# Patient Record
Sex: Male | Born: 1940 | Race: White | Hispanic: No | Marital: Married | Smoking: Former smoker
Health system: Southern US, Community
[De-identification: ages and names within clinical notes are randomized; demographics above are authoritative.]

## PROBLEM LIST (undated history)

## (undated) DIAGNOSIS — C61 Malignant neoplasm of prostate: Secondary | ICD-10-CM

## (undated) DIAGNOSIS — I714 Abdominal aortic aneurysm, without rupture, unspecified: Secondary | ICD-10-CM

## (undated) DIAGNOSIS — I10 Essential (primary) hypertension: Secondary | ICD-10-CM

## (undated) DIAGNOSIS — E785 Hyperlipidemia, unspecified: Secondary | ICD-10-CM

## (undated) DIAGNOSIS — M1990 Unspecified osteoarthritis, unspecified site: Secondary | ICD-10-CM

## (undated) DIAGNOSIS — I723 Aneurysm of iliac artery: Secondary | ICD-10-CM

## (undated) HISTORY — DX: Essential (primary) hypertension: I10

## (undated) HISTORY — PX: HERNIA REPAIR: SHX51

## (undated) HISTORY — DX: Aneurysm of iliac artery: I72.3

## (undated) HISTORY — PX: PROSTATE BIOPSY: SHX241

## (undated) HISTORY — DX: Abdominal aortic aneurysm, without rupture, unspecified: I71.40

## (undated) HISTORY — DX: Unspecified osteoarthritis, unspecified site: M19.90

## (undated) HISTORY — DX: Abdominal aortic aneurysm, without rupture: I71.4

## (undated) HISTORY — PX: ABDOMINAL AORTIC ANEURYSM REPAIR: SUR1152

## (undated) HISTORY — DX: Hyperlipidemia, unspecified: E78.5

---

## 1999-03-15 ENCOUNTER — Encounter: Payer: Self-pay | Admitting: *Deleted

## 1999-03-15 ENCOUNTER — Encounter: Admission: RE | Admit: 1999-03-15 | Discharge: 1999-03-15 | Payer: Self-pay | Admitting: *Deleted

## 2000-03-16 ENCOUNTER — Encounter: Payer: Self-pay | Admitting: *Deleted

## 2000-03-16 ENCOUNTER — Encounter: Admission: RE | Admit: 2000-03-16 | Discharge: 2000-03-16 | Payer: Self-pay | Admitting: *Deleted

## 2000-03-20 ENCOUNTER — Encounter: Payer: Self-pay | Admitting: *Deleted

## 2000-03-20 ENCOUNTER — Encounter: Admission: RE | Admit: 2000-03-20 | Discharge: 2000-03-20 | Payer: Self-pay | Admitting: *Deleted

## 2002-07-31 ENCOUNTER — Encounter: Payer: Self-pay | Admitting: Family Medicine

## 2002-07-31 ENCOUNTER — Encounter: Admission: RE | Admit: 2002-07-31 | Discharge: 2002-07-31 | Payer: Self-pay | Admitting: Family Medicine

## 2005-04-21 ENCOUNTER — Ambulatory Visit: Payer: Self-pay | Admitting: Family Medicine

## 2005-04-26 ENCOUNTER — Ambulatory Visit: Payer: Self-pay | Admitting: Family Medicine

## 2005-04-27 ENCOUNTER — Encounter: Admission: RE | Admit: 2005-04-27 | Discharge: 2005-04-27 | Payer: Self-pay | Admitting: Family Medicine

## 2005-06-10 ENCOUNTER — Ambulatory Visit: Payer: Self-pay | Admitting: Cardiology

## 2006-04-21 ENCOUNTER — Ambulatory Visit: Payer: Self-pay

## 2006-05-03 ENCOUNTER — Ambulatory Visit: Payer: Self-pay | Admitting: Family Medicine

## 2006-05-03 LAB — CONVERTED CEMR LAB
AST: 29 units/L (ref 0–37)
Albumin: 4 g/dL (ref 3.5–5.2)
Alkaline Phosphatase: 46 units/L (ref 39–117)
Basophils Absolute: 0 10*3/uL (ref 0.0–0.1)
CO2: 31 meq/L (ref 19–32)
Chloride: 103 meq/L (ref 96–112)
Creatinine, Ser: 0.7 mg/dL (ref 0.4–1.5)
GFR calc non Af Amer: 121 mL/min
Glucose, Bld: 119 mg/dL — ABNORMAL HIGH (ref 70–99)
HCT: 47.4 % (ref 39.0–52.0)
HDL: 52.1 mg/dL (ref >39.0)
Hgb A1c MFr Bld: 5.8 % (ref 4.6–6.0)
LDL Cholesterol: 93 mg/dL (ref 0–99)
Lymphocytes Relative: 25.6 % (ref 12.0–46.0)
MCHC: 34.3 g/dL (ref 30.0–36.0)
MCV: 95.6 fL (ref 78.0–100.0)
Monocytes Relative: 8.7 % (ref 3.0–11.0)
Neutro Abs: 4.8 10*3/uL (ref 1.4–7.7)
Neutrophils Relative %: 59 % (ref 43.0–77.0)
Platelets: 244 10*3/uL (ref 150–400)
Potassium: 3.9 meq/L (ref 3.5–5.1)
RBC: 4.95 M/uL (ref 4.22–5.81)
Sodium: 143 meq/L (ref 135–145)
Total Bilirubin: 1.6 mg/dL — ABNORMAL HIGH (ref 0.3–1.2)
VLDL: 17 mg/dL (ref 0–40)

## 2006-05-10 ENCOUNTER — Ambulatory Visit: Payer: Self-pay | Admitting: Family Medicine

## 2006-12-08 DIAGNOSIS — I10 Essential (primary) hypertension: Secondary | ICD-10-CM | POA: Insufficient documentation

## 2007-05-03 ENCOUNTER — Ambulatory Visit: Payer: Self-pay

## 2007-05-03 ENCOUNTER — Encounter: Payer: Self-pay | Admitting: Family Medicine

## 2007-05-09 ENCOUNTER — Telehealth: Payer: Self-pay | Admitting: Family Medicine

## 2007-05-24 DIAGNOSIS — I723 Aneurysm of iliac artery: Secondary | ICD-10-CM

## 2007-05-24 HISTORY — DX: Aneurysm of iliac artery: I72.3

## 2007-08-14 ENCOUNTER — Encounter: Payer: Self-pay | Admitting: Family Medicine

## 2007-08-15 ENCOUNTER — Ambulatory Visit: Payer: Self-pay | Admitting: Family Medicine

## 2007-08-15 DIAGNOSIS — Z9889 Other specified postprocedural states: Secondary | ICD-10-CM

## 2007-08-15 DIAGNOSIS — E785 Hyperlipidemia, unspecified: Secondary | ICD-10-CM | POA: Insufficient documentation

## 2007-08-15 DIAGNOSIS — M109 Gout, unspecified: Secondary | ICD-10-CM

## 2007-08-15 LAB — CONVERTED CEMR LAB
ALT: 59 units/L — ABNORMAL HIGH (ref 0–53)
AST: 36 units/L (ref 0–37)
BUN: 14 mg/dL (ref 6–23)
Basophils Absolute: 0 10*3/uL (ref 0.0–0.1)
Bilirubin, Direct: 0.3 mg/dL (ref 0.0–0.3)
Cholesterol: 168 mg/dL (ref 0–200)
Eosinophils Absolute: 0.5 10*3/uL (ref 0.0–0.6)
GFR calc non Af Amer: 103 mL/min
Glucose, Bld: 114 mg/dL — ABNORMAL HIGH (ref 70–99)
HCT: 51.4 % (ref 39.0–52.0)
Hemoglobin: 16.9 g/dL (ref 13.0–17.0)
Ketones, urine, test strip: NEGATIVE
LDL Cholesterol: 104 mg/dL — ABNORMAL HIGH (ref 0–99)
Lymphocytes Relative: 26.2 % (ref 12.0–46.0)
MCHC: 33 g/dL (ref 30.0–36.0)
MCV: 95.8 fL (ref 78.0–100.0)
Monocytes Absolute: 0.8 10*3/uL — ABNORMAL HIGH (ref 0.2–0.7)
Neutro Abs: 5.2 10*3/uL (ref 1.4–7.7)
Potassium: 5.1 meq/L (ref 3.5–5.1)
RDW: 12 % (ref 11.5–14.6)
Sodium: 142 meq/L (ref 135–145)
TSH: 2.11 microintl units/mL (ref 0.35–5.50)
Total CHOL/HDL Ratio: 3.7
Uric Acid, Serum: 9.4 mg/dL — ABNORMAL HIGH (ref 2.4–7.0)
Urobilinogen, UA: 0.2
pH: 5.5

## 2007-08-21 ENCOUNTER — Telehealth: Payer: Self-pay | Admitting: Family Medicine

## 2007-09-06 ENCOUNTER — Telehealth (INDEPENDENT_AMBULATORY_CARE_PROVIDER_SITE_OTHER): Payer: Self-pay | Admitting: *Deleted

## 2007-09-07 ENCOUNTER — Telehealth (INDEPENDENT_AMBULATORY_CARE_PROVIDER_SITE_OTHER): Payer: Self-pay | Admitting: *Deleted

## 2007-09-24 ENCOUNTER — Ambulatory Visit: Payer: Self-pay | Admitting: Internal Medicine

## 2007-09-25 ENCOUNTER — Ambulatory Visit: Payer: Self-pay | Admitting: Vascular Surgery

## 2007-10-02 ENCOUNTER — Ambulatory Visit: Payer: Self-pay | Admitting: Vascular Surgery

## 2007-10-02 ENCOUNTER — Encounter: Admission: RE | Admit: 2007-10-02 | Discharge: 2007-10-02 | Payer: Self-pay | Admitting: Vascular Surgery

## 2007-10-09 ENCOUNTER — Telehealth: Payer: Self-pay | Admitting: Family Medicine

## 2007-10-10 ENCOUNTER — Ambulatory Visit: Payer: Self-pay | Admitting: Internal Medicine

## 2007-10-22 ENCOUNTER — Ambulatory Visit: Payer: Self-pay

## 2007-10-30 ENCOUNTER — Ambulatory Visit: Payer: Self-pay | Admitting: Surgery

## 2007-10-30 ENCOUNTER — Ambulatory Visit (HOSPITAL_COMMUNITY): Admission: RE | Admit: 2007-10-30 | Discharge: 2007-10-30 | Payer: Self-pay | Admitting: Surgery

## 2007-11-02 ENCOUNTER — Inpatient Hospital Stay (HOSPITAL_COMMUNITY): Admission: RE | Admit: 2007-11-02 | Discharge: 2007-11-03 | Payer: Self-pay | Admitting: Vascular Surgery

## 2007-11-20 ENCOUNTER — Ambulatory Visit: Payer: Self-pay | Admitting: Vascular Surgery

## 2007-11-27 ENCOUNTER — Encounter: Admission: RE | Admit: 2007-11-27 | Discharge: 2007-11-27 | Payer: Self-pay | Admitting: Vascular Surgery

## 2007-11-27 ENCOUNTER — Ambulatory Visit: Payer: Self-pay | Admitting: Vascular Surgery

## 2007-11-30 ENCOUNTER — Inpatient Hospital Stay (HOSPITAL_COMMUNITY): Admission: RE | Admit: 2007-11-30 | Discharge: 2007-12-01 | Payer: Self-pay | Admitting: Vascular Surgery

## 2007-11-30 IMAGING — RF DG ABDOMEN 1V
1 series · 4 of 4 positions shown · non-contrast
Comparison: [DATE]

CLINICAL DATA: Abdominal aortic aneurysm, stent graft repair

ABDOMEN - 1 VIEW

[Series 1: run · 4 of 4 slices shown]
[im 1/4]
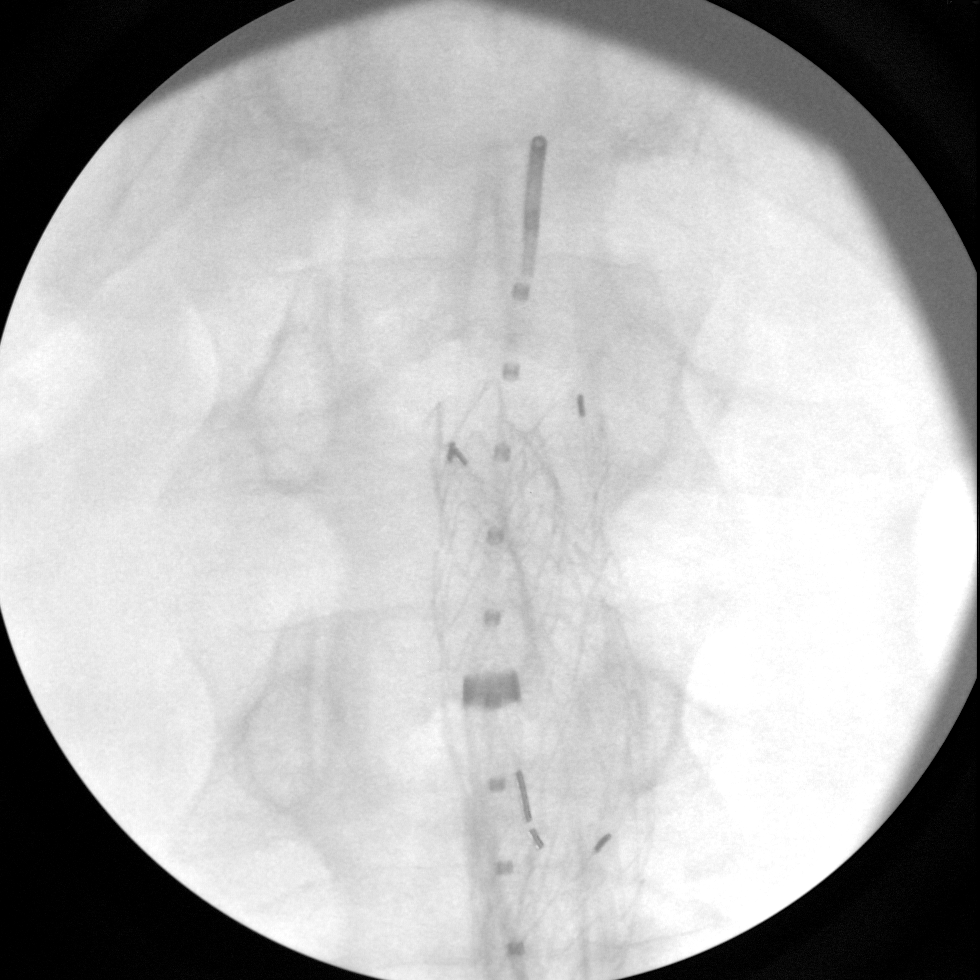
[im 2/4]
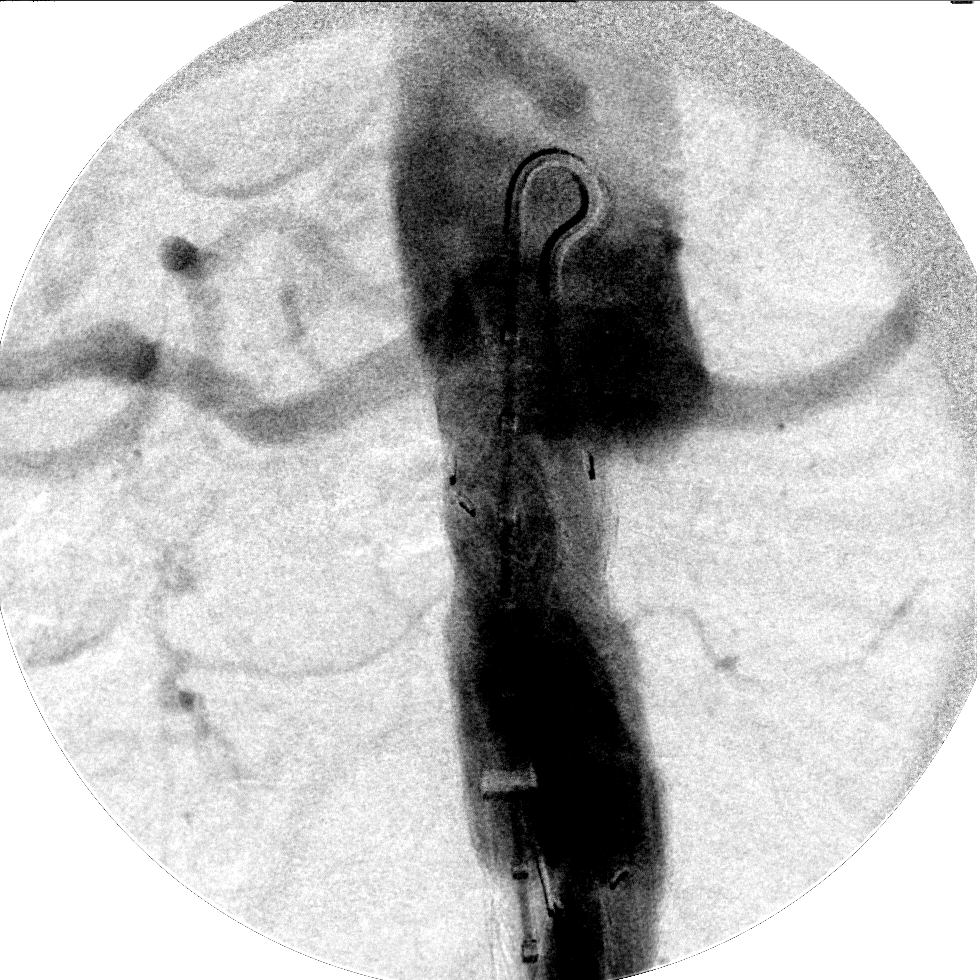
[im 3/4]
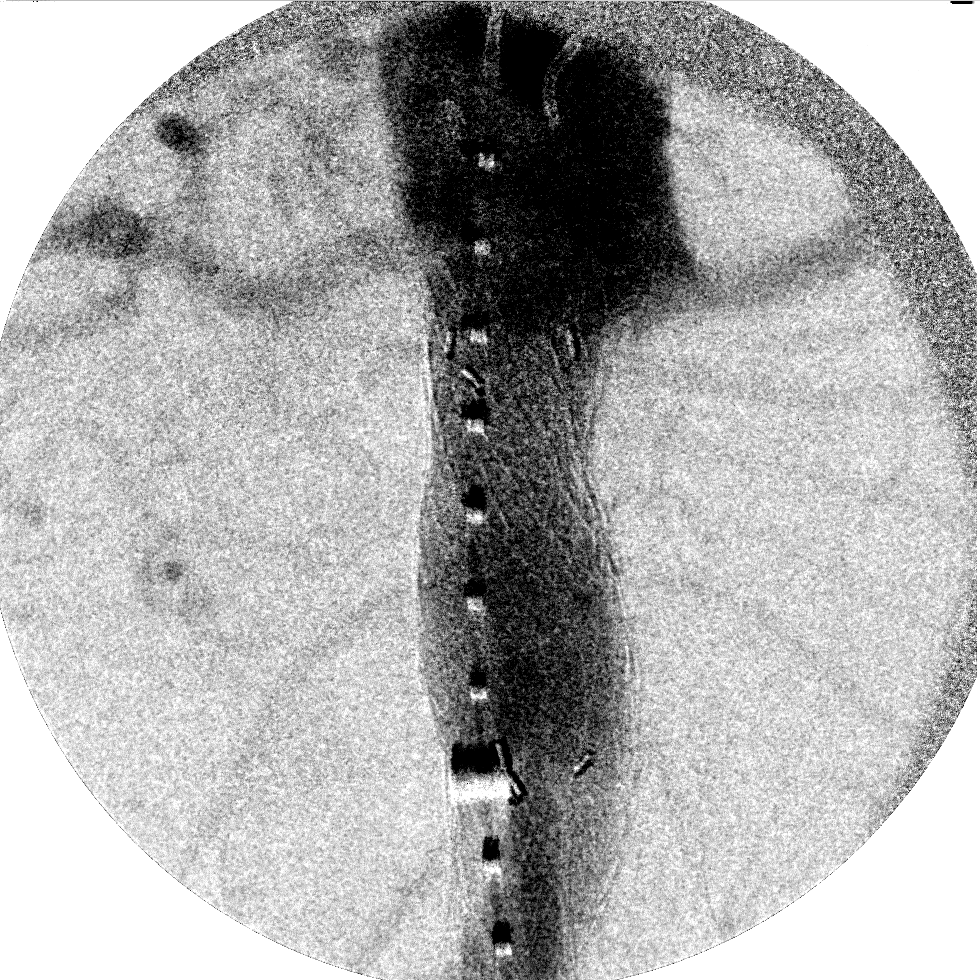
[im 4/4]
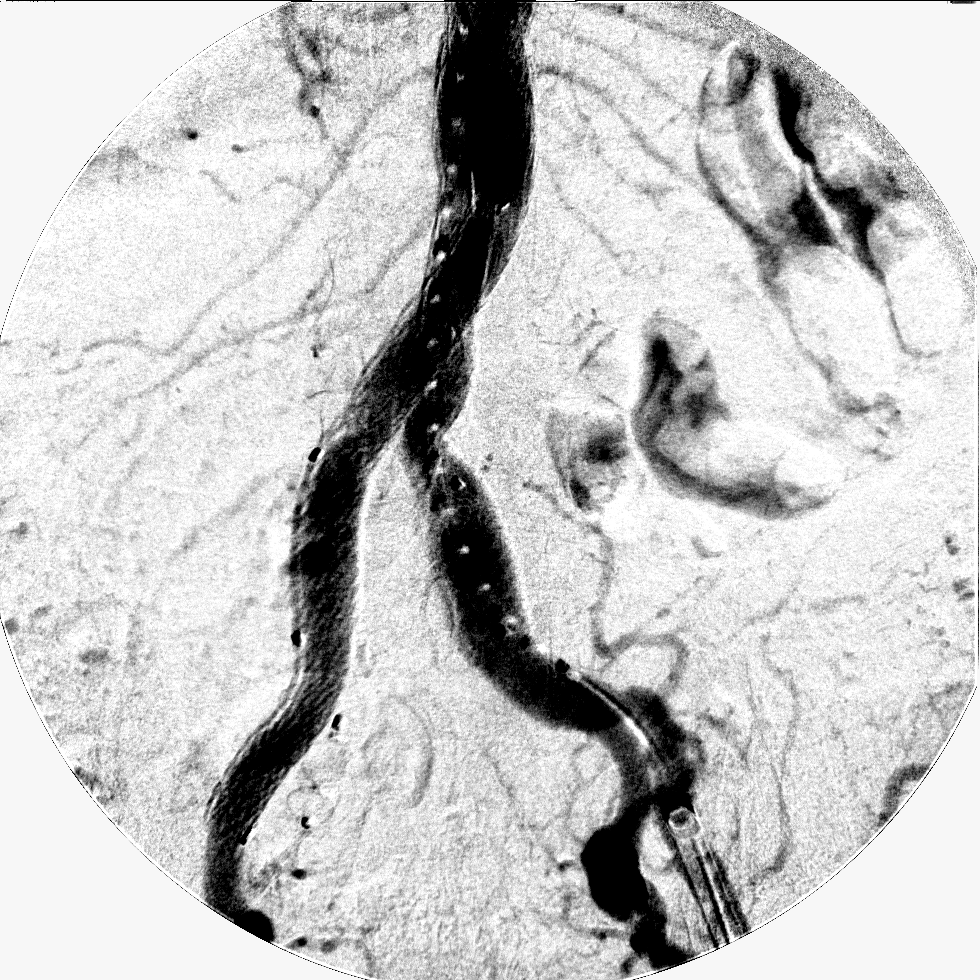

[4 of 4 positions shown; findings below may reference images not displayed]

FINDINGS: Four spot fluoroscopic views were obtained during
intraoperative angiogram of the abdominal aortic stent graft.
Within the limits of the study, the aortic stent graft is patent.
Iliac limbs are patent.  No evidence of stenosis or occlusion.
IMPRESSION: Patent aortic stent graft.

## 2007-12-17 ENCOUNTER — Ambulatory Visit: Payer: Self-pay | Admitting: Vascular Surgery

## 2007-12-18 ENCOUNTER — Ambulatory Visit: Payer: Self-pay | Admitting: Vascular Surgery

## 2007-12-20 ENCOUNTER — Ambulatory Visit: Payer: Self-pay | Admitting: *Deleted

## 2007-12-25 ENCOUNTER — Ambulatory Visit: Payer: Self-pay | Admitting: Vascular Surgery

## 2008-01-08 ENCOUNTER — Ambulatory Visit: Payer: Self-pay | Admitting: Vascular Surgery

## 2008-01-08 ENCOUNTER — Encounter: Admission: RE | Admit: 2008-01-08 | Discharge: 2008-01-08 | Payer: Self-pay | Admitting: Vascular Surgery

## 2008-07-01 ENCOUNTER — Ambulatory Visit: Payer: Self-pay | Admitting: Vascular Surgery

## 2008-07-01 ENCOUNTER — Encounter: Admission: RE | Admit: 2008-07-01 | Discharge: 2008-07-01 | Payer: Self-pay | Admitting: Vascular Surgery

## 2009-01-20 ENCOUNTER — Encounter: Admission: RE | Admit: 2009-01-20 | Discharge: 2009-01-20 | Payer: Self-pay | Admitting: Vascular Surgery

## 2009-01-20 ENCOUNTER — Ambulatory Visit: Payer: Self-pay | Admitting: Vascular Surgery

## 2009-05-05 ENCOUNTER — Ambulatory Visit: Payer: Self-pay | Admitting: Family Medicine

## 2009-07-13 ENCOUNTER — Ambulatory Visit: Payer: Self-pay | Admitting: Vascular Surgery

## 2010-01-19 ENCOUNTER — Encounter: Admission: RE | Admit: 2010-01-19 | Discharge: 2010-01-19 | Payer: Self-pay | Admitting: Vascular Surgery

## 2010-01-19 ENCOUNTER — Ambulatory Visit: Payer: Self-pay | Admitting: Vascular Surgery

## 2010-06-13 ENCOUNTER — Encounter: Payer: Self-pay | Admitting: Family Medicine

## 2010-06-20 LAB — CONVERTED CEMR LAB
AST: 36 units/L (ref 0–37)
Basophils Absolute: 0 10*3/uL (ref 0.0–0.1)
Bilirubin, Direct: 0.2 mg/dL (ref 0.0–0.3)
Blood in Urine, dipstick: NEGATIVE
Eosinophils Relative: 7.3 % — ABNORMAL HIGH (ref 0.0–5.0)
GFR calc non Af Amer: 119.35 mL/min (ref >60)
Glucose, Urine, Semiquant: NEGATIVE
HCT: 44.5 % (ref 39.0–52.0)
HDL: 42.9 mg/dL (ref >39.00)
Hemoglobin: 15.6 g/dL (ref 13.0–17.0)
Lymphocytes Relative: 33.8 % (ref 12.0–46.0)
MCHC: 35 g/dL (ref 30.0–36.0)
MCV: 95.1 fL (ref 78.0–100.0)
Monocytes Absolute: 0.6 10*3/uL (ref 0.1–1.0)
Neutrophils Relative %: 50.8 % (ref 43.0–77.0)
Nitrite: NEGATIVE
RBC: 4.68 M/uL (ref 4.22–5.81)
Specific Gravity, Urine: 1.02
TSH: 2.15 microintl units/mL (ref 0.35–5.50)
Total CHOL/HDL Ratio: 3
Uric Acid, Serum: 9.1 mg/dL — ABNORMAL HIGH (ref 4.0–7.8)
Urobilinogen, UA: 0.2
VLDL: 19.6 mg/dL (ref 0.0–40.0)
WBC Urine, dipstick: NEGATIVE
WBC: 7.9 10*3/uL (ref 4.5–10.5)
pH: 5

## 2010-08-09 ENCOUNTER — Other Ambulatory Visit: Payer: Self-pay | Admitting: Family Medicine

## 2010-08-10 ENCOUNTER — Telehealth: Payer: Self-pay | Admitting: Family Medicine

## 2010-08-10 NOTE — Telephone Encounter (Signed)
Pt needs refills call into [facility] [phone] of ziac 2.5-6.25mg . Pt is sch for physical on 09-29-2010

## 2010-08-11 ENCOUNTER — Other Ambulatory Visit: Payer: Self-pay

## 2010-08-11 MED ORDER — COLCHICINE 0.6 MG PO TABS: 0.6000 mg | ORAL_TABLET | Freq: Three times a day (TID) | ORAL | Status: DC | PRN

## 2010-08-11 MED ORDER — ALLOPURINOL 300 MG PO TABS: 300.0000 mg | ORAL_TABLET | Freq: Every day | ORAL | Status: DC

## 2010-08-11 MED ORDER — BISOPROLOL-HYDROCHLOROTHIAZIDE 2.5-6.25 MG PO TABS: 1.0000 | ORAL_TABLET | Freq: Every day | ORAL | Status: DC

## 2010-08-11 MED ORDER — ATORVASTATIN CALCIUM 20 MG PO TABS: 20.0000 mg | ORAL_TABLET | Freq: Every day | ORAL | Status: DC

## 2010-08-11 NOTE — Telephone Encounter (Signed)
Pt was into office today - all meds refilled per Dr. Tawanna Cooler - sent to pharmacy. KIK

## 2010-09-29 ENCOUNTER — Ambulatory Visit (INDEPENDENT_AMBULATORY_CARE_PROVIDER_SITE_OTHER): Payer: Medicare Other | Admitting: Family Medicine

## 2010-09-29 ENCOUNTER — Encounter: Payer: Self-pay | Admitting: Family Medicine

## 2010-09-29 DIAGNOSIS — M109 Gout, unspecified: Secondary | ICD-10-CM

## 2010-09-29 DIAGNOSIS — R4789 Other speech disturbances: Secondary | ICD-10-CM

## 2010-09-29 DIAGNOSIS — N139 Obstructive and reflux uropathy, unspecified: Secondary | ICD-10-CM

## 2010-09-29 DIAGNOSIS — I10 Essential (primary) hypertension: Secondary | ICD-10-CM

## 2010-09-29 DIAGNOSIS — R4702 Dysphasia: Secondary | ICD-10-CM

## 2010-09-29 DIAGNOSIS — E785 Hyperlipidemia, unspecified: Secondary | ICD-10-CM

## 2010-09-29 DIAGNOSIS — N138 Other obstructive and reflux uropathy: Secondary | ICD-10-CM

## 2010-09-29 DIAGNOSIS — L309 Dermatitis, unspecified: Secondary | ICD-10-CM | POA: Insufficient documentation

## 2010-09-29 DIAGNOSIS — L259 Unspecified contact dermatitis, unspecified cause: Secondary | ICD-10-CM

## 2010-09-29 DIAGNOSIS — N401 Enlarged prostate with lower urinary tract symptoms: Secondary | ICD-10-CM

## 2010-09-29 DIAGNOSIS — Z9889 Other specified postprocedural states: Secondary | ICD-10-CM

## 2010-09-29 LAB — BASIC METABOLIC PANEL
CO2: 27 mEq/L (ref 19–32)
Calcium: 9.3 mg/dL (ref 8.4–10.5)
Creatinine, Ser: 0.7 mg/dL (ref 0.4–1.5)
GFR: 111.48 mL/min (ref >60.00)
Glucose, Bld: 113 mg/dL — ABNORMAL HIGH (ref 70–99)
Sodium: 141 mEq/L (ref 135–145)

## 2010-09-29 LAB — CBC WITH DIFFERENTIAL/PLATELET
Basophils Absolute: 0 10*3/uL (ref 0.0–0.1)
Eosinophils Absolute: 0.5 10*3/uL (ref 0.0–0.7)
Hemoglobin: 15.9 g/dL (ref 13.0–17.0)
Lymphocytes Relative: 26.8 % (ref 12.0–46.0)
MCHC: 34.4 g/dL (ref 30.0–36.0)
Monocytes Relative: 8.6 % (ref 3.0–12.0)
Neutrophils Relative %: 58.3 % (ref 43.0–77.0)
RBC: 4.66 Mil/uL (ref 4.22–5.81)
RDW: 13.8 % (ref 11.5–14.6)

## 2010-09-29 LAB — POCT URINALYSIS DIPSTICK
Bilirubin, UA: NEGATIVE
Blood, UA: NEGATIVE
Glucose, UA: NEGATIVE
Spec Grav, UA: 1.025

## 2010-09-29 LAB — LIPID PANEL
Cholesterol: 175 mg/dL (ref 0–200)
Triglycerides: 104 mg/dL (ref 0.0–149.0)

## 2010-09-29 LAB — TSH: TSH: 1.65 u[IU]/mL (ref 0.35–5.50)

## 2010-09-29 LAB — HEPATIC FUNCTION PANEL
ALT: 37 U/L (ref 0–53)
Albumin: 4 g/dL (ref 3.5–5.2)
Bilirubin, Direct: 0.2 mg/dL (ref 0.0–0.3)
Total Protein: 6.8 g/dL (ref 6.0–8.3)

## 2010-09-29 LAB — PSA: PSA: 3.33 ng/mL (ref 0.10–4.00)

## 2010-09-29 LAB — URIC ACID: Uric Acid, Serum: 5.8 mg/dL (ref 4.0–7.8)

## 2010-09-29 MED ORDER — BISOPROLOL-HYDROCHLOROTHIAZIDE 2.5-6.25 MG PO TABS: 1.0000 | ORAL_TABLET | Freq: Every day | ORAL | Status: DC

## 2010-09-29 MED ORDER — ATORVASTATIN CALCIUM 20 MG PO TABS: 20.0000 mg | ORAL_TABLET | Freq: Every day | ORAL | Status: DC

## 2010-09-29 MED ORDER — ALLOPURINOL 300 MG PO TABS: 300.0000 mg | ORAL_TABLET | Freq: Every day | ORAL | Status: DC

## 2010-09-29 MED ORDER — PREDNISONE 20 MG PO TABS: ORAL_TABLET | ORAL | Status: DC

## 2010-09-29 NOTE — Patient Instructions (Signed)
Continue your current medications.  Let's take a 3 month course of prednisone to try to keep y hand to heal  Return in one year, sooner if any problem

## 2010-09-29 NOTE — Progress Notes (Signed)
Subjective:    Patient ID: Adrian Reynolds, male    DOB: [birth year], 70 y.o.   MRN: [id number]  HPI Adrian Reynolds is a delightful, 70 year old, married man nonsmoker, who comes in today for a Medicare wellness examination because of a history of gout, hyperlipidemia, hypertension, history of ruptured aortic aneurysm, and eczema.  He takes allopurinol 300 mg daily and has no gouty attacks.  He takes Lipitor 20 mg nightly for hyperlipidemia.  Will check lipid panel today.  He takes Ziac 2.5 -- 6.25 daily for hypertension.  BP 130/80 at home.  He gets every 6 month ultrasound because of a history of a ruptured aortic aneurysm.  Asymptomatic followed by vascular surgery Dr. Hart Rochester.  He has a history of eczema.  He is tried numerous medications none of which works.  He's been given a shot of steroids every 3 months at the dermatologist office.  Last steroid injection was 3 months ago.  We talked about alternatives he would like to try some oral prednisone.  History eye  care, dental care, hearing normal, activities of daily living.  Normal.  He walks daily.  No guns in the house.  He does have a living will and health-care power-of-attorney.  Tetanus 2006, Pneumovax 2009, information given on shingles, colonoscopy, normal in GI.  Review of systems negative except is having trouble swallowing.  He says for the past year staging hotdogs gets stuck in his upper esophagus.  We will refer him to GI for endoscopy.  It sounds like he has a stricture   Review of Systems  Constitutional: Negative.   HENT: Negative.   Eyes: Negative.   Respiratory: Negative.   Cardiovascular: Negative.   Gastrointestinal: Negative.   Genitourinary: Negative.   Musculoskeletal: Negative.   Skin: Positive for rash.  Neurological: Negative.   Hematological: Negative.   Psychiatric/Behavioral: Negative.        Objective:   Physical Exam  Constitutional: He is oriented to person, place, and time. He appears  well-developed and well-nourished.  HENT:  Head: Normocephalic and atraumatic.  Right Ear: External ear normal.  Left Ear: External ear normal.  Nose: Nose normal.  Mouth/Throat: Oropharynx is clear and moist.  Eyes: Conjunctivae and EOM are normal. Pupils are equal, round, and reactive to light.  Neck: Normal range of motion. Neck supple. No JVD present. No tracheal deviation present. No thyromegaly present.  Cardiovascular: Normal rate, regular rhythm, normal heart sounds and intact distal pulses.  Exam reveals no gallop and no friction rub.   No murmur heard. Pulmonary/Chest: Effort normal and breath sounds normal. No stridor. No respiratory distress. He has no wheezes. He has no rales. He exhibits no tenderness.  Abdominal: Soft. Bowel sounds are normal. He exhibits no distension and no mass. There is no tenderness. There is no rebound and no guarding.  Genitourinary: Rectum normal, prostate normal and penis normal. Guaiac negative stool. No penile tenderness.  Musculoskeletal: Normal range of motion. He exhibits no edema and no tenderness.  Lymphadenopathy:    He has no cervical adenopathy.  Neurological: He is alert and oriented to person, place, and time. He has normal reflexes. No cranial nerve deficit. He exhibits normal muscle tone.  Skin: Skin is warm and dry. No rash noted. No erythema. No pallor.       Scar in the midline from previous AAA repair, also eczema hands and severe with cracking of the skin  Psychiatric: He has a normal mood and affect. His behavior is normal. Judgment and  thought content normal.          Assessment & Plan:  Healthy male.  History of gout continue allopurinol 300 mg daily.  History of hyperlipidemia.  Continue Lipitor 20 daily.  History of hypertension.  Continue Ziac 2.5 -- 6.5 daily.  History of TURP for a repair.  Follow-up every 6 months by Dr. Hart Rochester.  History of eczema, prednisone burst and taper.  History of recent upper  esophageal symptoms consistent with a stricture referred to GI for endoscopy evaluation

## 2010-09-30 NOTE — Progress Notes (Signed)
patient  Is aware 

## 2010-10-05 NOTE — H&P (Signed)
NAMESHAHZAIN, KIESTER               ACCOUNT NO.:  [id number]   MEDICAL RECORD NO.:  [id number]          PATIENT TYPE:  INP   LOCATION:  3308                         FACILITY:  [facility]   PHYSICIAN:  Quita Skye. Hart Rochester, M.D.  DATE OF BIRTH:  08-Nov-1940   DATE OF ADMISSION:  11/30/2007  DATE OF DISCHARGE:                              HISTORY & PHYSICAL   CHIEF COMPLAINT:  Infolding of proximal portion of Gore aortic stent  graft.   HISTORY OF PRESENT ILLNESS:  This 70 year old male patient underwent  aortic stent grafting by Dr. Hart Rochester on May 19 for right common iliac  aneurysm.  The patient had previously had a ruptured abdominal aortic  aneurysm at [facility] repaired by Dr. 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  After  the stent graft was inserted, he had an unremarkable course, was  discharged on the first postoperative day, and returned in 4 weeks for  his routine CT angiogram.  This revealed some infolding of the left  portion of the proximal part of the Gore aortic stent graft just below  the renal arteries.  There was no hemodynamic consequences of this with  excellent distal pulses, normal pressures, and good waveforms in the  feet.  After studying this carefully, consulting with my colleagues and  the [employer], we decided that there was a concern that, that  could become a hemodynamic consequence of this if we left it treated or  could be a site for thrombus formation, so ideally this should be  treated with balloon angioplasty and Palmaz stenting.  Therefore, he was  admitted at this time for that procedure.   PAST MEDICAL HISTORY:  1. Hypertension.  2. Hyperlipidemia.  3. Negative for diabetes, coronary artery disease, COPD, and stroke.   PAST SURGICAL HISTORY:  1. Resection of abdominal aortic aneurysm.  2. Mesh ventral hernia repair for incisional hernia.  3. Aorto-bi-iliac stent grafting as mentioned  above.   FAMILY HISTORY:  Positive for diabetes, coronary artery disease, and  stroke in his father who underwent coronary artery bypass grafting.   SOCIAL HISTORY:  He has 3 children, is not retired, owns a [employer], married, does not use tobacco and has not for the past  15 years, drinks occasional alcohol.   REVIEW OF SYSTEMS:  Unremarkable.   ALLERGIES:  None known.   MEDICATIONS:  Lipitor.   PHYSICAL EXAMINATION:  VITAL SIGNS:  Blood pressure 154/84, heart rate  56, respirations 14.  GENERAL:  He is a healthy-appearing middle-aged male in no apparent  distress.  Alert and oriented x3.  NECK:  Supple with 3+ carotid pulses palpable.  No bruits are audible.  NEUROLOGIC:  Normal.  ABDOMEN:  Soft, nontender with no palpable masses.  He has well-healed  midline incision.  He has well-healed suprainguinal incision bilaterally  with 3+ femoral, popliteal, and dorsalis pedis pulses.   IMPRESSION:  1. Infolding of aorto-bi-iliac stent graft, recently placed.  2. History of ruptured abdominal aortic aneurysm.  3. Status post repair of ventral hernia  with mesh.  4. Hypertension.   PLAN:  Admit the patient on July 10 for attempt at balloon angioplasty  and insertion of Palmaz stent at the proximal portion of this aortic  graft to relieve the infolding of the graft.  Risks and benefits have  been thoroughly discussed with the patient and he would like to proceed.      Quita Skye Hart Rochester, M.D.  Electronically Signed     JDL/MEDQ  D:  11/30/2007  T:  11/30/2007  Job:  [id number]

## 2010-10-05 NOTE — Assessment & Plan Note (Signed)
OFFICE VISIT   Adrian Reynolds, Adrian Reynolds  DOB:  02-10-1941                                       07/01/2008  ZOXWR#:[id number]   The patient returns today for further followup of his aortic stent graft  to repair a right iliac artery aneurysm.  He had previously undergone an  aortic tube graft for a ruptured abdominal aortic aneurysm many years  ago.  He did require a second procedure because of infolding of the  proximal portion of the stent graft and this was treated with a 40 mm x  4 cm Palmaz stent with good success.  He has had no abdominal or back  symptoms and states actually that the right hip feels better than it did  prior to this procedure for some reason.  He is able to ambulate without  claudication.  He denies any chest pain, dyspnea on exertion, PND,  orthopnea or any neurologic symptoms suggestive of hemiparesis, aphasia  or amaurosis fugax.  He takes one 81 mg aspirin tablet per day.   PHYSICAL EXAMINATION:  Vital signs:  Blood pressure 150/87, heart rate  63, respirations 14.  Neck:  Carotid pulses 3+, no audible bruits.  Neurological:  Normal.  Chest:  Auscultation.  Abdomen:  Soft,  nontender, no pulsatile masses palpable.  He has 3+ femoral, popliteal  and dorsalis pedis pulses bilaterally.  The inguinal incision remains  well-healed.   CT angiogram was reviewed by me today and looks excellent with no  evidence of endoleak or migration and the infolding of the proximal  portion of the stent continues to be widely patent at this point.  He  was reassured regarding these findings and will return in 6 months for  followup CT angiogram for his 1 year check.   Quita Skye Hart Rochester, M.D.  Electronically Signed   JDL/MEDQ  D:  07/01/2008  T:  07/02/2008  Job:  [id number]   cc:   Tinnie Gens A. Tawanna Cooler, MD

## 2010-10-05 NOTE — Op Note (Signed)
Adrian Reynolds, Adrian Reynolds               ACCOUNT NO.:  [id number]   MEDICAL RECORD NO.:  [id number]          PATIENT TYPE:  INP   LOCATION:  3316                         FACILITY:  [facility]   PHYSICIAN:  Juleen China IV, MDDATE OF BIRTH:  03/14/1941   DATE OF PROCEDURE:  11/02/2007  DATE OF DISCHARGE:  11/03/2007                               OPERATIVE REPORT   PREOPERATIVE DIAGNOSIS:  Right common iliac aneurysm.   POSTOPERATIVE DIAGNOSIS:  Right common iliac aneurysm.   PROCEDURE PERFORMED:  Left groin exposure.   ANESTHESIA:  General.   BLOOD LOSS:  200 mL.   INDICATIONS:  This is a 70 year old gentleman who has previously  undergone an open intrarenal repair with two graft of a ruptured  aneurysm.  He has represented with a right common iliac aneurysm.  Prior  to his repair, he has had embolization of his right hypogastric artery  anticipation of placing in the graft.  Risks and benefits were  discussed.  Informed consent was signed.  This will be the dictation of  the exposure of the left femoral artery.  Please see Dr. Candie Chroman note  for full details of the procedure.   DESCRIPTION OF PROCEDURE:  After general anesthesia was administered,  the patient was prepped and draped in a standard sterile fashion.  A  time-out was called and antibiotics were given.  Again, I performed the  exposure of the left femoral artery.  The inguinal ligament was  identified by bony landmarks.  An oblique incision was made below the  inguinal ligament above the palpable pulse of left common femoral  artery.  Bovie cautery was used to dissect through the subcutaneous  tissue.  Self-retaining retractors were used to aid with exposure.  The  femoral sheath was identified and opened sharply.  There was hematoma  from the previous access for his arteriogram.  Ultimately, the common  femoral artery was identified and mobilized proximally and distally such  that the vessel loops could be placed.  Please  see Dr. Candie Chroman note for  full details of the endovascular portion of procedure.   Once the endovascular portion of the procedure had been completed, I set  to close the left groin.  The sheaths and wires were removed and the  cautery was occluded with a Henley clamp proximally and a peripheral  DeBakey distally.  A running 5-0 Prolene was used to close the  arteriotomy.  Prior to completion, the artery was flushed in antegrade  and retrograde fashion.  The anastomosis was secured and the clamps were  released.  Hemostasis was excellent.  The groin was then copiously  irrigated.  Femoral sheath was then reapproximated using running 2-0  Vicryl.  The subcutaneous tissue was closed with additional layers of 2-  0 and 3-0 Vicryl.  The skin was closed with 4-0 Vicryl.  Sterile  dressings were applied.  The patient tolerated the procedure well and  taken to recovery room in stable condition.           ______________________________  V. Charlena Cross, MD  Electronically Signed  VWB/MEDQ  D:  11/04/2007  T:  11/05/2007  Job:  [id number]

## 2010-10-05 NOTE — Op Note (Signed)
Adrian Reynolds, Adrian Reynolds               ACCOUNT NO.:  [id number]   MEDICAL RECORD NO.:  [id number]          PATIENT TYPE:  INP   LOCATION:  3316                         FACILITY:  [facility]   PHYSICIAN:  Quita Skye. Hart Rochester, M.D.  DATE OF BIRTH:  08/27/1940   DATE OF PROCEDURE:  11/02/2007  DATE OF DISCHARGE:                               OPERATIVE REPORT   PREOPERATIVE DIAGNOSIS:  Right common iliac artery aneurysm.   POSTOPERATIVE DIAGNOSIS:  Right common iliac artery aneurysm.   OPERATIONS:  1. Bilateral common femoral artery exposure, right side by Dr. Hart Rochester,      left side by Dr. Durene Cal; he will dictate that portion of the      procedure.  2. Insertion of an aorto to right external iliac and left common iliac      artery Gore Excluder stent graft using #1 31-mm x 14-mm x 17-cm      main body via right side.  3. 12 mm x 10 cm extender to the right external iliac artery.  4. 18 mm x 11.5 cm contralateral limb on the left side.   SURGEONS:  Quita Skye. Hart Rochester, M.D. and V. Durene Cal IV, MD   ANESTHESIA:  General endotracheal.   BRIEF HISTORY:  This patient suffered a ruptured abdominal aortic  aneurysm many years ago, had an tube graft inserted, [facility] in  [place].  He now returns with enlarging right common iliac artery  aneurysm and scheduled for aortic stent grafting for this iliac  aneurysm.   PROCEDURE:  The patient taken to the operating room, placed in supine  position at which time a satisfactory general endotracheal anesthesia  was administered.  Radial arterial line, Swan-Ganz catheter or a central  venous sheath were inserted by Anesthesia.  Abdomen and groins were  prepped with Betadine scrub solution and draped in routine sterile  manner.  Common femoral arteries were exposed bilaterally through  suprainguinal oblique incisions; right side by Dr. Hart Rochester, left side by  Dr. Myra Gianotti.  Common femoral arteries were exposed down to the  bifurcation.  Six  thousands units of heparin given intravenously on the  right side, an 8-mm sheath was inserted over a guidewire and on the left  side similarly a 30-cm x 8-mm sheath was inserted over a guidewire.  Pigtail catheter positioned in the suprarenal aorta from the left.  A 31-  mm x 14-mm x 17-cm aortoiliac Gore excluder stent graft was then  positioned appropriately through a 20-mm sheath through the right common  femoral artery over an Amplatz wire.  To verify the level of the renal  arteries, angiogram was performed through a pigtail catheter via the  left side and this was marked for orientation purposes.  The graft was  then deployed just short of the aortic bifurcation with the  contralateral limb coming off on the right anterolaterally.  The gate  was then cannulated via the left common femoral approach through the  sheath without difficulty and the contralateral limb, which was an 18-mm  x 11.5-cm limb was deployed.  The length was determined by retrograde  angiogram through the sheath on the left to locate the position of the  left internal iliac artery to be certain this was not covered.  After  this was deployed, the remainder of the right limb was deployed and a  retrograde angiogram was performed through the right sheath.  The right  internal iliac artery had been embolized preoperatively to get an  adequate seal.  In order to get appropriate coverage and a seal in the  right external iliac artery, a 12-mm x 10-cm limb was deployed.  A  completion angiogram was then performed after dilating all of the  junctions with a CODA aortic balloon.  The completion angiogram revealed  no evidence of any endo leaks.  A good seal proximally and distally and  good filling of both renal arteries, as well as the left internal iliac  artery.  Sheaths were then removed over the guidewires and adequate  hemostasis was achieved after repairing both common femoral arteries  with continuous 6-0 Prolene.   Following this, the wounds were closed in  layers with Vicryl in a subcuticular fashion.  Sterile dressings  applied.  The patient taken to recovery room in stable condition.      Quita Skye Hart Rochester, M.D.  Electronically Signed     JDL/MEDQ  D:  11/02/2007  T:  11/03/2007  Job:  [id number]

## 2010-10-05 NOTE — Assessment & Plan Note (Signed)
OFFICE VISIT   Adrian Reynolds, Adrian Reynolds  DOB:  [birth year]                                       12/20/2007  BOFBP#:[id number]   The patient returned to the office today with continued drainage from  his left groin incision.  Culture reveals sensitive Staph aureus.  He is  on Keflex.  The wound was further opened today and probed.  He will be  repacked and he will follow up with Dr. Hart Rochester next week.   Balinda Quails, M.D.  Electronically Signed   PGH/MEDQ  D:  12/20/2007  T:  12/21/2007  Job:  [id number]

## 2010-10-05 NOTE — Consult Note (Signed)
VASCULAR SURGERY CONSULTATION   Adrian Reynolds, Adrian Reynolds  DOB:  [birth year]                                       09/25/2007  EAVWU#:[id number]   The patient was referred for vascular surgery consultation by Dr. 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  This patient gives a history of having a ruptured  abdominal aortic aneurysm treated emergently at [facility] by Dr.  Sheron Nightingale in 1994.  Since that time he has had some ultrasound studies  over the years which have followed his aortic aneurysm.  He also had a  study performed at [facility] December of 2008 which revealed  the suprarenal aortic to be 3.3 x 3.2 cm and the right common iliac  artery to be greater than 3 cm (3.0 x 3.4 cm).  He has not had CT  scanning performed in many years he states.  He is having no abdominal  or back symptoms.   PAST MEDICAL HISTORY:  1. Hypertension.  2. Hyperlipidemia.  3. Negative for diabetes, coronary artery disease, COPD, stroke.   PAST SURGICAL HISTORY:  Resection and graft of ruptured abdominal aortic  aneurysm.   FAMILY HISTORY:  Positive for diabetes, coronary artery disease and  stroke in his father who underwent coronary artery bypass grafting.   SOCIAL HISTORY:  He has three children.  He is not retired.  He is  married.  He does not use tobacco, has not for the past 15 years.  Drinks occasional alcohol.   REVIEW OF SYSTEMS:  Unremarkable with the exception of orthopedics where  he has some left hip arthritis and has had history of plantar fasciitis.  Negative for general, cardiac, pulmonary, GI, GU, neuro.   ALLERGIES:  None known.   PHYSICAL EXAM:  Vital signs:  Blood pressure 154/84, heart rate 56,  respirations 14.  General:  He is a healthy-appearing male in no  apparent distress, alert and oriented x3.  Neck:  The neck is supple, 3+  carotid pulses palpable.  No bruits are audible.   Neurological:  Normal.  No palpable adenopathy in the neck.  No skin rashes noted.  Chest:  Clear to auscultation.  Cardiovascular:  Reveals a regular rhythm with  no murmurs.  Abdomen:  Soft, nontender with no pulsatile mass palpable.  Midline incision is well-healed with no evidence of ventral hernia.  He  has 3+ femoral popliteal and posterior tibial pulses palpable  bilaterally.  No distal edema is noted.   I think we should perform a CT angiogram to determine exactly what the  status of his suprarenal aorta and infrarenal aorta is, where the graft  was placed.  Also we need to evaluate the right common iliac artery for  aneurysmal disease.  He will return next week to discuss these findings.   Quita Skye Hart Rochester, M.D.  Electronically Signed  JDL/MEDQ  D:  09/25/2007  T:  09/26/2007  Job:  [id number]   cc:   Tinnie Gens A. Tawanna Cooler, MD

## 2010-10-05 NOTE — Assessment & Plan Note (Signed)
OFFICE VISIT   Adrian Reynolds, Adrian Reynolds  DOB:  [birth year]                                       11/20/2007  ZOXWR#:[id number]   The patient had endovascular repair of an aneurysm by Dr. Hart Rochester on  11/02/2007.  He had developed some slight erythema at the superior  aspect of his right groin incision and he came in today to have this  checked.  He has had no fever or chills.   PHYSICAL EXAMINATION:  On examination the incisions look fine except for  the lateral aspect of his right groin incision which had some mild  erythema and swelling.  I opened this in the office today and obtained a  small not stitch abscess.  We applied bacitracin and gauze and he will  do this daily at home.  He will follow up with Dr. Hart Rochester at his  regularly scheduled visit.  At this point I do not see any indication  for antibiotics.   Di Kindle. Edilia Bo, M.D.  Electronically Signed   CSD/MEDQ  D:  11/20/2007  T:  11/21/2007  Job:  [id number]

## 2010-10-05 NOTE — Assessment & Plan Note (Signed)
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  He had previously had treatment for a ruptured  abdominal aortic aneurysm in [facility] about 20 years ago with  insertion of an infrarenal aortic straight graft.  His postoperative  course was complicated by the fact that the proximal portion of his  aortic stent graft had some infolding noted at his first postoperative  visit and this was treated with a Palmaz stent to enlarge this area of  the proximal implantation site.  Since then he has gotten along quite  well with no further problems and has resumed his normal activities.  Six months ago he had a CT angiogram performed which showed excellent  position of the stent graft and no evidence of any endo leak.   Today once again he had CT angiogram which I have reviewed today.  The  stent graft is in excellent position with no evidence of any endo leak  or migration of the graft.   He denies any neurologic symptoms such as hemiparesis, aphasia,  amaurosis fugax, diplopia, blurred vision, syncope.  He has had no chest  pain, dyspnea on exertion, PND, orthopnea or claudication.  He does take  one aspirin per day.   PHYSICAL EXAM:  Blood pressure 136/83, heart rate is 52, respirations  19.  His carotid pulses are 3+, no bruits.  Abdomen is soft, nontender  with no pulsatile mass noted.  He has 3+ femoral pulses bilaterally with  well-perfused lower extremities and nicely healed inguinal incisions.   I reassured him regarding these findings.  I plan to see him in 6 months  with a duplex scan of the stent graft to be performed in our office for  followup and then we will see him in 6 more months with a CT angiogram  and then follow him  on an annual basis.   Adrian Reynolds, M.D.  Electronically Signed   JDL/MEDQ  D:  01/20/2009  T:  01/21/2009  Job:  [id number]   cc:   Tinnie Gens A. Tawanna Cooler, MD

## 2010-10-05 NOTE — Assessment & Plan Note (Signed)
OFFICE VISIT   KOSTAS, MARROW  DOB:  1941-03-06                                       01/19/2010  AVWUJ#:[id number]   The patient returns today for continued followup regarding his stent  graft Emeline Darling excluder) to treat a right common iliac aneurysm.  This was  performed in June of 2009 and required a second procedure to insert a  Palmaz stent because of some infolding of the proximal area of the  graft.  He had previously had a ruptured abdominal aortic aneurysm  treated with an aortic straight graft many years ago in [place].  He has done well in the past 12 months with no complaints of abdominal  discomfort or back pain.  He also denies any neurologic symptoms such as  hemiparesis, aphasia, amaurosis fugax, diplopia, blurred vision or  syncope.  He has no claudication symptoms.  He continues to take one  aspirin per day.   CHRONIC MEDICAL PROBLEMS:  1. Hypertension.  2. Previous ventral hernia repair with mesh.  3. History of ruptured abdominal aortic aneurysm.   SOCIAL HISTORY:  He is married, has three children and has not smoked in  17 years, drinks occasional alcohol.   REVIEW OF SYSTEMS:  Is negative for chest pain, dyspnea on exertion,  PND, orthopnea.  No neurologic or pulmonary problems.   PHYSICAL EXAMINATION:  Vital signs:  His blood pressure is 169/90, heart  rate is 48, respirations 14.  General:  A well-developed, well-nourished  male in no apparent distress, alert and oriented times 3.  HEENT:  Normal for age.  EOMs intact.  Lungs:  Clear to auscultation.  No  rhonchi or wheezing.  Cardiovascular:  Regular rhythm, no murmurs.  Carotid pulses 3+, no bruits.  Abdomen:  Soft, nontender with no  pulsatile mass noted.  He has 3+ femoral and dorsalis pedis pulses  palpable bilaterally.  He does have varicose veins in both lower  extremities, more prominent on the left, beginning in the distal thigh  extending into the pretibial area  with some prominent reticular and  small varicosities.  There is no hyperpigmentation or ulceration.  Right  leg has lesser degree of varicosities of the great saphenous system.  He  states these are asymptomatic at the present time but will keep an eye  on them for enlargement.  He will return in 1 year for a duplex scan of  his aneurysm and stent graft repair to be done in our office and to see  me at that time.     Quita Skye Hart Rochester, M.D.  Electronically Signed   JDL/MEDQ  D:  01/19/2010  T:  01/20/2010  Job:  [id number]

## 2010-10-05 NOTE — Op Note (Signed)
NAME:  MANG, HAZELRIGG               ACCOUNT NO.:  [id number]   MEDICAL RECORD NO.:  [id number]          PATIENT TYPE:  AMB   LOCATION:  SDS                          FACILITY:  [facility]   PHYSICIAN:  Juleen China IV, MDDATE OF BIRTH:  Nov 21, 1940   DATE OF PROCEDURE:  DATE OF DISCHARGE:  10/30/2007                               OPERATIVE REPORT   PREOPERATIVE DIAGNOSIS:  Right iliac aneurysm.   POSTOPERATIVE DIAGNOSIS:  Right iliac aneurysm.   PROCEDURES:  1. Ultrasound-guided left common femoral artery access.  2. Abdominal aortogram with bilateral runoffs.  3. Second-order catheterization.  4. Embolization of right hypogastric artery with 12-mm Amplatzer plug.   INDICATIONS:  This is a 70 year old gentleman with previous history of  infrarenal tube graft who now has a right common iliac aneurysm.  Plan  is for endovascular exclusion.  Prior to performing this, the patient  needs embolization of his right hypogastric artery.  Risks and benefits  were discussed.  Informed consent was signed.   PROCEDURE IN DETAIL:  The patient was identified in the holding area and  taken to room 7.  He was placed supine on the table.  Bilateral groins  were prepped and draped in standard sterile fashion.  The left common  femoral artery was evaluated with ultrasound and was found to be widely  patent.  Lidocaine 1% was used for local anesthesia.  Using an 18-gauge  needle, the left common femoral artery was accessed under ultrasound  guidance.  An 0.035 Bentson wire was advanced in retrograde fashion into  the abdominal aorta under fluoroscopic visualization.  Next, a 5-French  sheath was placed over the wire.  A marker pigtail catheter was placed  at the level of L1, and an abdominal aortogram was obtained.  Next, the  catheter was pulled down to the aortic bifurcation, and a pelvic  angiogram was obtained.   FINDINGS:  Aortogram:  The visualized portions of suprarenal abdominal  aorta show  aneurysmal degeneration.  There are single renal arteries  bilaterally.  There is aneurysmal aorta down to the level of the aortic  tube graft, which is easily visualized.  There is ectasia of bilateral  common iliac arteries.  There is aneurysm visualized within the right  common iliac artery.  The external iliac arteries are ectatic without  evidence of stenosis or aneurysm.   INTERVENTION:  At this time, an Omni Flush catheter and a Bentson wire  were used to cross the aortic bifurcation.  I was able to advance the  Bentson wire into the hypogastric artery on the right.  The catheter was  then advanced into the hypogastric artery, and a wire exchange was  performed, and a Rosen wire was placed into the right hypogastric  artery.  Next, a 6-French 45-cm tromboned sheath was advanced into the  common iliac artery.  Contrast injection was performed to delineate the  bifurcation of the external and hypogastric arteries.  Next, the  tromboned sheath was advanced into the right hypogastric artery.  Contrast injection was performed to confirm successful cannulation of  the hypogastric artery.  Next, a 12-mm Amplatzer plug was selected and  then successfully deployed into the right hypogastric artery.  It was  repositioned once so that it was situated near the origin of the right  hypogastric artery.  A final contrast injection was performed, which  revealed successful embolization of the right hypogastric artery and  widely patent right external iliac artery.   At this point in time, catheters and wires were removed.  The sheath was  withdrawn to the left external iliac artery, and he was taken to the  holding area in stable condition.  There were no complications.   IMPRESSION:  1. Successful embolization of the right hypogastric artery using a 12-      mm Amplatzer plug.  2. Right common iliac aneurysm.  3. 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            ______________________________  V. Charlena Cross, MD  Electronically Signed     VWB/MEDQ  D:  10/30/2007  T:  10/31/2007  Job:  [id number]

## 2010-10-05 NOTE — Discharge Summary (Signed)
NAMEFINNEAN, CERAMI               ACCOUNT NO.:  [id number]   MEDICAL RECORD NO.:  [id number]          PATIENT TYPE:  INP   LOCATION:  3308                         FACILITY:  [facility]   PHYSICIAN:  Quita Skye. Hart Rochester, M.D.  DATE OF BIRTH:  Jan 26, 1941   DATE OF ADMISSION:  11/30/2007  DATE OF DISCHARGE:  12/01/2007                               DISCHARGE SUMMARY   DIAGNOSES:  1. Infolding of proximal portion of Gore aortic stent graft.  2. Hyperlipidemia.  3. Hypertension.  4. Aortobiiliac stenting status post.   MEDICATIONS:  Lipitor.   CONDITION ON DISCHARGE:  Stable and improving.   DISCHARGE MEDICATIONS:  1. Ziac 2.5 mg p.o. q.a.m.  2. Lipitor 20 mg p.o. q.a.m.  3. Baby aspirin p.o. daily.  4. Colchicine 0.6 mg p.o. p.r.n.   COMPLICATIONS:  None.   DISPOSITION:  He is being discharged home in stable condition.  He is to  follow up with Dr. Hart Rochester in 4 weeks with ABIs and CTA of abdomen and  pelvis.   BRIEF IDENTIFYING STATEMENT:  For complete details, please refer to  typed history and physical.  Briefly, this 70 year old gentleman  underwent stent grafting on Oct 09, 2007, for common iliac aneurysm.  Followup demonstrated infolding of the left portion of the proximal part  of the aortic stent graft just below the renal arteries.  There was a  concern for thrombus formation and balloon angioplasty __________ was  recommended.  Mr. Lacko was informed of the risks and benefits of the  procedure and after careful consideration, he elected to proceed with  surgery.   HOSPITAL COURSE:  Preoperative workup was completed as an outpatient.  He was brought in through Same-Day Surgery and underwent the  aforementioned insertion of the Palmaz__________ stent.  For complete  details, please refer to typed operative report.  The procedure was  without complications.  He was returned to the Post  Anesthesia Care Unit extubated.  Following stabilization, he was  transferred to  Step-Down Unit.  He remained stable the following  morning.  He was desirous of discharge.  His wound was healing well.  He  was discharged home in stable condition.      Wilmon Arms, PA      Quita Skye Hart Rochester, M.D.  Electronically Signed    KEL/MEDQ  D:  12/17/2007  T:  12/18/2007  Job:  [id number]

## 2010-10-05 NOTE — Op Note (Signed)
NAMEBURTON, GAHAN               ACCOUNT NO.:  [id number]   MEDICAL RECORD NO.:  [id number]          PATIENT TYPE:  INP   LOCATION:  3308                         FACILITY:  [facility]   PHYSICIAN:  Quita Skye. Hart Rochester, M.D.  DATE OF BIRTH:  [birth year]   DATE OF PROCEDURE:  11/30/2007  DATE OF DISCHARGE:                               OPERATIVE REPORT   PREOPERATIVE DIAGNOSIS:  Infolding of proximal aspect of Gore-Excluder  aorto-bi-iliac stent graft.   POSTOPERATIVE DIAGNOSIS:  Infolding of proximal aspect of Gore-Excluder  aorto-bi-iliac stent graft.   OPERATION:  1. Left common femoral cutdown with primary repair, left common      femoral artery.  2. PTA of proximal portion of Gore-Excluder aortic stent graft using a      Coda balloon catheter.  3. Insertion of Palmaz stent (40 mm-4 cm) over a Z-Med angioplasty      catheter (40 mm-4 cm).  4. Completion angiogram of abdominal aorta and iliac artery.   SURGEON:  Quita Skye. Hart Rochester, MD   FIRST ASSISTANT:  1. Durene Cal IV, MD   ANESTHESIA:  General endotracheal.   PROCEDURE IN DETAIL:  The patient was taken to the operating room and  placed in supine position at which time satisfactory general  endotracheal anesthesia was administered.  Abdomen and groins were  prepped with Betadine scrub solution and draped in routine sterile  manner.  A incision was made in the suprainguinal area on the left side  through the previous scar made 4 weeks ago when the aortic stent graft  was inserted.  This was carried down through subcutaneous tissue and  scar tissue and the femoral artery was exposed.  There was dense  adhesion surrounding it.  Superficial femoral and profunda femoris  arteries were dissected free and encircled with vessel loops.  The  common femoral artery was exposed up to the point where the previous  arteriotomy had been made.  Heparin 6000 units was given intravenously.  The distal left common femoral artery was then  punctured, guidewire  (Bentson) was then passed into the suprarenal aorta under fluoroscopic  guidance.  A 16-French sheath and dilator were then passed over the  guidewire and easily traversed the aortic stent graft up to the body of  the graft.  Following this, the pigtail catheter was placed over the  guidewire, positioned in the suprarenal aorta, and abdominal aortogram  performed injecting 20 mL of contrast at 20 mL per second.  This  revealed the aorta to be widely patent with single widely patent renal  arteries bilaterally.  The stent graft extended up to within about 1 cm  of the renal arteries.  The infrarenal neck which measured about 2-3 cm  in length prior to be anastomosed to an old aortic tube graft showed  some significant size discrepancy between the old tube graft and the  size of the aorta.  The stent graft was well opposed on the right side  of the patient, but on the left side there was significant infolding  which had been noted on the CT scan.  This  was appreciated on the  angiogram which was performed.  Following this, the pigtail catheter was  removed over the guidewire.  A Coda balloon was then placed in the  distal part of the body of the aortic graft just above the flow divider  and inflated gently to hopefully expand the graft.  This was  sequentially done from distal to proximal up to the proximal portion of  the graft to try to remove any wrinkles or infolding as much as  possible.  After this had been completed, a 40 mm-4 cm Palmaz stent was  placed on a Z-Med angioplasty catheter.  It was then positioned  fluoroscopically with the proximal aspect being right at the proximal  portion of the stent graft.  This was deployed using the sheath to trap  the stent distally and to be precise with deployment.  After it had been  completely deployed and the sheath was removed or retracted proximally,  this was again inflated with the Coda balloon and there seemed to  be  improved apposition on the left side but not complete resolution.  It  was felt that this was as well as this could be done and obviously there  was concern that there could be some thrombus outside of the stent graft  in this cul-de-sac and I did not want to manipulate this too much  for  fear of dislodging that.  Completion angiogram was then performed using  the pigtail catheter which again revealed widely patent renal arteries  and excellent rapid flow through both limbs of the stent graft.  Following this, the pigtail catheter was removed.  The sheath was  removed and after this the femoral artery was repaired with continuous 6-  0 Prolene suture.  The clamps were released.  There was an excellent  pulse and Doppler flow in the left leg and also palpable dorsalis pedis  pulses in both feet.  Protamine was given to reverse the heparin.  Following adequate hemostasis, the wounds were irrigated with saline,  closed in layers with Vicryl in subcuticular fashion.  Sterile dressing  was applied.  The patient was taken to recovery room in satisfactory  condition.      Quita Skye Hart Rochester, M.D.  Electronically Signed     JDL/MEDQ  D:  11/30/2007  T:  12/01/2007  Job:  [id number]

## 2010-10-05 NOTE — Assessment & Plan Note (Signed)
OFFICE VISIT   Adrian Reynolds, Adrian Reynolds  DOB:  1940-06-22                                       01/08/2008  ZOXWR#:[id number]   The patient returns for further examination of the left inguinal wound.  It has been packed twice daily by his wife and she is barely able to get  any packing in the lateral aspect.  There has been no purulence.  He had  no chills and fever.  He had a CT angiogram performed today which I have  reviewed and reveals excellent location of the stent graft with  correction of the infolding which occurred at the proximal portion which  required a Palmaz stent.  There is good position of the wall except for  the small area where there is some thrombus outside of the stent graft.  There is no evidence of endoleak and the right iliac aneurysm is  thrombosed.   EXAM:  Abdomen:  Soft, nontender with no pulsatile mass.  Right inguinal  incision well-healed.  Left inguinal incision has a 1 cm area laterally  which is granulating and should heal in the next few days.  He has  excellent distal pulses.  His blood pressure 142/78, heart rate 58.   He will return in 6 months with a followup CT angiogram for further  evaluation of the stent graft.   Quita Skye Hart Rochester, M.D.  Electronically Signed   JDL/MEDQ  D:  01/08/2008  T:  01/09/2008  Job:  [id number]

## 2010-10-05 NOTE — Assessment & Plan Note (Signed)
OFFICE VISIT   Adrian Reynolds, Adrian Reynolds  DOB:  [birth year]                                       12/17/2007  IONGE#:[id number]   The patient returns post revision of his aortic stent graft which was  done on July 10, at which point he had a Palmaz stent placed at the  proximal portion of the graft via the left common femoral approach.  He  has had some drainage from the left inguinal wound over the last few  days and some erythema but no fever, chills or other symptoms.  He  returns from a week of vacation at the beach.   EXAMINATION:  Today his blood pressure 130/70, heart rate 78.  The left  angle incision has some mild erythema surrounding it.  I was able to  express some purulence from the medial aspect of the subcuticularly  closed wound in the super inguinal area on the left.  This was cultured.  It did have some purulence to it.  I opened the medial portion with a  hemostat to about 1 cm and was unable to get any further drainage.  We  did pack this with an iodoform gauze.  He had excellent distal pulses in  the left leg and abdominal exam is unremarkable.   Will start him on Keflex 500 mg q.i.d., check cultures in 2 days.  He  will return next week for further wound check or sooner if he should  develop any increasing erythema, fever or other symptoms.   Quita Skye Hart Rochester, M.D.  Electronically Signed   JDL/MEDQ  D:  12/17/2007  T:  12/18/2007  Job:  [id number]

## 2010-10-05 NOTE — Assessment & Plan Note (Signed)
OFFICE VISIT   [name], [name]  DOB:  [birth year]                                       12/18/2007  TFTDD#:[id number]   The patient returned today for wound examination.  The medial aspect of  the wound, which was opened yesterday, has decreased purulent drainage.  There is some erythema laterally and I did open a small amount of this  area over 1 cm with a hemostat and got a slight amount of purulent  drainage.  Temperature is 97.6.  He has had no chills or fever at home.  He is on Keflex and the culture shows gram-positive cocci.  He will  continue twice a day dressing changes by his wife, who is a nurse, and  watch for fever, chills or increasing redness.  He will call Thursday to  check for sensitivities of the culture and to see if the Keflex is  appropriate.  Return to see me next week unless this worsens.   Quita Skye Hart Rochester, M.D.  Electronically Signed   JDL/MEDQ  D:  12/18/2007  T:  12/19/2007  Job:  [id number]

## 2010-10-05 NOTE — Assessment & Plan Note (Signed)
OFFICE VISIT   BRECKIN, SAVANNAH  DOB:  15-Jan-1941                                       12/25/2007  VFIEP#:[id number]   The patient returns today for further followup regarding his left  inguinal wound infection following revision of his aortic stent graft on  July 10.  There is no purulent drainage coming from the 2 open areas of  the groin wound today and his wife has been packing this twice a day.  He has been showering.  He is on Keflex.  This wound grew staph aureus  which is sensitive to cephalosporin.  Repacked this today and he will  continue twice daily wound packing, given another prescription for 1  week of Keflex 500 mg q.i.d. and he will return in 2 weeks with followup  CT angiogram of his stent graft.  Today temperature is 97.7, blood  pressure 166/89.   Quita Skye Hart Rochester, M.D.  Electronically Signed   JDL/MEDQ  D:  12/25/2007  T:  12/26/2007  Job:  [id number]

## 2010-10-05 NOTE — Procedures (Signed)
ENDOVASCULAR STENT GRAFT EXAM   INDICATION:  Followup of a AAA and right CIA stent.   HISTORY:  Abdominal aortic aneurysm and right CI aneurysm.                           DUPLEX EVALUATION   AAA Sac Size:                 2.8 cm CM AP          3.25 cm CM TRV  Previous Sac Size:            3.3 cm CM AP          3.2 cm CM TRV  Evidence of an endoleak?      No                    No   Velocity Criteria:  Proximal Aorta                0.63 cm/sec  Proximal Stent Graft          0.75 cm/sec  Main Body Stent Graft-Mid     0.64 cm/sec  Right Limb-Proximal           0.92 cm/sec  Right Limb-Distal             0.68 cm/sec  Left Limb-Proximal            115 cm/sec  Left Limb-Distal              107 cm/sec  Patent Renal Arteries?        Yes                   Yes   IMPRESSION:  1. Patent Endostent and right CIA stent with no evidence of leak or      stenosis.  2. Right CIA stent measuring 1.82 x 2 cm.  3. Ankle-brachial indices within normal limits bilaterally.      ___________________________________________  Quita Skye Hart Rochester, M.D.   CJ/MEDQ  D:  07/13/2009  T:  07/13/2009  Job:  [id number]

## 2010-10-05 NOTE — H&P (Signed)
HISTORY AND PHYSICAL EXAMINATION   Oct 09, 2007   Re:  Adrian Reynolds, Adrian Reynolds               DOB:  [birth year]   CHIEF COMPLAINT:  Right common iliac artery aneurysm.   HISTORY OF PRESENT ILLNESS:  This 70 year old male patient was referred  by Dr. Alonza Smoker to evaluate a right common iliac aneurysm and possible  suprarenal aortic aneurysm.  The patient has a history of a ruptured  abdominal aortic aneurysm treated urgently in [facility] by Dr.  Viviano Simas in 1994.  He had had subsequent ultrasound studies over the  years performed at [facility] which revealed some dilatation of  the suprarenal aorta in the 3.3 cm range as well as a right common iliac  artery aneurysm in the 3-3.4 cm range.  He had a CT angiogram performed  which revealed the suprarenal aorta to be of satisfactory size and not  aneurysmal, it was slightly dilated with a widely patent infrarenal  aortic tube graft.  He did have a focal saccular right common iliac  aneurysm measuring 3.5 cm in diameter and following appropriate  evaluation he was scheduled for aortobi-iliac stent grafting.  Prior to  his stent graft insertion he will have embolization of his right  internal iliac artery to allow insertion of the stent graft.   PAST MEDICAL HISTORY:  1. Hypertension.  2. Hyperlipidemia.  3. Negative for diabetes, coronary artery disease, COPD and stroke.   PAST SURGICAL HISTORY:  Previous surgery includes resection and grafting  ruptured abdominal aortic aneurysm.   FAMILY HISTORY:  Positive for diabetes, coronary artery disease and  stroke in his father who underwent coronary artery bypass grafting.   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  He is married, does not use tobacco and has  not for the past 15 years.  Drinks occasional alcohol.   REVIEW OF SYSTEMS:  Is unremarkable with the exception of orthopedic  problems where he has  had some left hip arthritis and some history of  plantar fasciitis.  Negative for general, cardiac, pulmonary, GI, GU and  neuro.  No chest pain, dyspnea on exertion, PND or orthopnea.   ALLERGIES:  None known.   MEDICATION:  Lipitor.   PHYSICAL EXAM:  Vital signs:  Blood pressure 154/84, heart rate 56,  respirations 14.  General:  He is a healthy-appearing middle-aged male  in no apparent distress, alert and oriented x3.  Neck: Supple, 3+  carotid pulses palpable.  No bruits are audible.  Neurological:  Normal.  No palpable adenopathy in the neck.  Skin:  No skin rash is noted.  Chest: Clear to auscultation.  Cardiovascular:  Regular rhythm with no  murmurs.  Abdomen:  Soft, nontender with no palpable masses.  He has a  well-healed midline incision with no evidence of ventral hernia.  He  does have a 3+ femoral, popliteal and posterior tibial pulses palpable  bilaterally.  No distal edema is noted.   ADDENDUM:  Other surgical procedure is repair of ventral hernia using  mesh.   IMPRESSION:  1. Right common iliac artery aneurysm.  2. Status post ruptured abdominal aortic aneurysm.  3. Status post repair of ventral hernia with mesh.  4. Hypertension.   PLAN:  Admit the patient on 06/10 for elective aortic stent grafting to  exclude his right common iliac artery aneurysm.  Risks and benefits have  been  thoroughly discussed with the patient and his wife and they would  like to proceed.  He will have a Cardiolite test prior to admission at  [facility].   Quita Skye Hart Rochester, M.D.  Electronically Signed   JDL/MEDQ  D:  10/09/2007  T:  10/10/2007  Job:  [id number]   cc:   Tinnie Gens A. Tawanna Cooler, MD

## 2010-10-05 NOTE — Assessment & Plan Note (Signed)
OFFICE VISIT   COLEN, ELTZROTH  DOB:  15-Aug-1940                                       11/27/2007  [id number]#:[id number]   The patient returns 4 weeks post insertion of aortobi-iliac stent graft  for right common iliac artery aneurysm.  He had previously had a  ruptured aortic aneurysm treated with tube graft in [place]  several years ago.  He had an embolization of his right internal iliac  artery followed by an aorta to the right external and left common iliac  stent graft Emeline Darling).  He has done quite well clinically with no  complaints of abdominal or back symptoms.  He has no claudication  symptoms.  He has some mild right buttock claudication, probably from  the internal iliac embolization, but that seems to be improving.   Blood pressure today is 125/78, heart rate is 58, respirations 14.  Abdomen:  Soft, nontender with no pulsatile mass noted.  He has  excellent femoral popliteal, and posterior tibial pulses bilaterally  with ABIs greater than 1 in both legs and triphasic flow.   I reviewed the CT angiogram today.  There appears to be some mild  infolding of the proximal portion of the graft is below the renal  arteries on the left side.  There is no contrast noted around the aortic  stent graft distally, although the radiologist interpreted this as a  type 1A endoleak.  Obviously, there is no aneurysm sac to fill since the  aneurysm has been resected.  Hemodynamically, there are no flow problems  related to this.  I reviewed this with Dr. Madilyn Fireman and also spoke with  Trena Platt from [facility] today to get their thoughts about it, and reviewed  it with Dr. Myra Gianotti and Early, andf we all feel that this needs to be  addressed because of the concern of thrombus forming at the proximal  site or acute occlusion. He is scheduled for a procedure on Friday of  this week to angioplasty and stent the proximal end. I discussed this  with Mr. Legate and he  understands the problem.Quita Skye. Hart Rochester, M.D.  Electronically Signed   JDL/MEDQ  D:  11/27/2007  T:  11/28/2007  Job:  [id number]

## 2010-10-05 NOTE — Assessment & Plan Note (Signed)
OFFICE VISIT   DELOYD, Adrian Reynolds  DOB:  1940/09/05                                       10/02/2007  [id number]#:[id number]   The patient returned today for further discussion regarding his right  common iliac aneurysm.  He had a CT angiogram today which I reviewed.  This reveals no significant dilatation of his suprarenal aorta and a  widely patent aortic tube graft where the ruptured aneurysm was  repaired.  He does have a saccular right common iliac aneurysm and I  reviewed the CT angiogram from 2007 and compared it to today's studies.  It appears to me that the aneurysm measures about 3.6 mm in diameter,  although the radiology interpretation was 2.9 mm in diameter.  It has  enlarged slightly from the study in 2007.   I reviewed these pictures with the patient and his wife today and I  discussed the pros and cons of proceeding with a stent graft repair.  We  will study this further to see what options are available and decide  about following this with further CT angiogram versus intervening at  this time.   PHYSICAL EXAMINATION:  Vital signs:  On exam today his blood pressure is  164/90, heart rate is 56, respirations are 18.  Abdomen:  Abdomen is  soft, nontender with no pulsatile mass palpable.  He has excellent  femoral pulses bilaterally with well-perfused lower extremities.   I will discuss this this week with my colleagues and stent graft  representative and make a decision after talking with the patient next  week.   Quita Skye Hart Rochester, M.D.  Electronically Signed   JDL/MEDQ  D:  10/02/2007  T:  10/03/2007  Job:  [id number]

## 2010-10-05 NOTE — Discharge Summary (Signed)
NAMEGARCIA, Adrian Reynolds               ACCOUNT NO.:  [id number]   MEDICAL RECORD NO.:  [id number]          PATIENT TYPE:  INP   LOCATION:  3316                         FACILITY:  [facility]   PHYSICIAN:  Quita Skye. Hart Rochester, M.D.  DATE OF BIRTH:  1941-02-06   DATE OF ADMISSION:  11/02/2007  DATE OF DISCHARGE:  11/03/2007                               DISCHARGE SUMMARY   DISCHARGE DIAGNOSES:  1. Right common iliac artery aneurysm.  2. Hypertension.  3. Hyperlipidemia.   PROCEDURES PERFORMED:  On November 02, 2007, insertion of an aorto to right  external iliac and left common iliac artery a Gore-Excluder stent graft  for right common iliac artery aneurysm.   SURGEONS:  Quita Skye. Hart Rochester, MD.  1. Durene Cal IV, MD.   HOSPITAL COURSE:  A 70 year old male patient was found to have an  enlarging right common iliac artery aneurysm.  He has previously had a  ruptured abdominal aortic aneurysm treated urgently in [facility]  in 1994 and the right iliac aneurysm has enlarged to the 3 cm to 3.5 cm  range.  He was admitted for repair using aortobiiliac stent grafting.   PAST MEDICAL HISTORY:  Hypertension, hyperlipidemia, and a ruptured  aortic aneurysm.   HOSPITAL COURSE:  The patient was admitted on November 02, 2007, taken to  the operating room, where Dr. Hart Rochester and Dr. Myra Gianotti performed  aortobiiliac  stent grafting using a Gore-Excluder device via bilateral  common femoral artery cutdown.  This was done without complication with  good placement of the graft.  No evidence of endoleak and a smooth  postoperative course.  His vital signs were stable for the 24-hour  period.  Following the procedure blood pressure 140/60, heart rate in  the 50s and remaining afebrile.  He tolerated clear liquids the evening  of surgery and had adequate urinary output.  The following day his vital  signs remained stable with hematocrit of 36%, creatinine of 1.4, and BUN  of 6.  He had no pulsatile mass in his  abdomen, incisions were healing  satisfactorily and he had 3+ dorsalis pedis pulses bilaterally.  He was  discharged the morning following surgery following removal of his Foley  catheter and adequate voiding.  He will return to see Dr. Hart Rochester in 4  weeks with CT angiography to be performed at the day of his return to  the office to check the placement of the graft.   DISCHARGE MEDICATIONS:  Are the same as the time of admission, please  see history and physical plus Percocet for pain.   CONDITION AT THE TIME OF DISCHARGE:  Improved.      Quita Skye Hart Rochester, M.D.  Electronically Signed     JDL/MEDQ  D:  11/03/2007  T:  11/03/2007  Job:  [id number]

## 2011-01-17 ENCOUNTER — Encounter: Payer: Self-pay | Admitting: Vascular Surgery

## 2011-01-18 ENCOUNTER — Encounter: Payer: Self-pay | Admitting: Vascular Surgery

## 2011-01-18 ENCOUNTER — Ambulatory Visit (INDEPENDENT_AMBULATORY_CARE_PROVIDER_SITE_OTHER): Payer: Medicare Other | Admitting: Vascular Surgery

## 2011-01-18 VITALS — BP 153/79 | HR 51 | Resp 16 | Ht 72.0 in | Wt 250.0 lb

## 2011-01-18 DIAGNOSIS — I714 Abdominal aortic aneurysm, without rupture, unspecified: Secondary | ICD-10-CM

## 2011-01-18 DIAGNOSIS — I713 Abdominal aortic aneurysm, ruptured, unspecified: Secondary | ICD-10-CM

## 2011-01-18 DIAGNOSIS — Z48812 Encounter for surgical aftercare following surgery on the circulatory system: Secondary | ICD-10-CM

## 2011-01-18 NOTE — Progress Notes (Signed)
Subjective:     Patient ID: Adrian Reynolds, male   DOB: [birth year], 70 y.o.   MRN: [id number]  HPI this 70 year old male patient returns for followup regarding an aortic stent graft placed in June of 2009. He had previously had a ruptured abdominal aortic aneurysm treated in [place] 20 years earlier. The stent graft was placed because the right common iliac artery aneurysm. He did require a trip back to the operating room 6 weeks later in July of 2009. There was some infolding at the proximal end of the graft requiring placement of the palmaz  stent. He denies any abdominal or back symptoms at this time.  Past Medical History  Diagnosis Date  . Hypertension   . Hyperlipidemia   . AAA (abdominal aortic aneurysm)   . Arthritis     left hip    History  Substance Use Topics  . Smoking status: Former Smoker    Types: Cigarettes    Quit date: 05/23/1990  . Smokeless tobacco: Not on file  . Alcohol Use: Yes     occasionally    Family History  Problem Relation Age of Onset  . Diabetes Father   . Stroke Father   . Coronary artery disease Father     No Known Allergies  Current outpatient prescriptions:allopurinol (ZYLOPRIM) 300 MG tablet, Take 1 tablet (300 mg total) by mouth daily., Disp: 100 tablet, Rfl: 3;  aspirin 81 MG tablet, Take 81 mg by mouth daily.  , Disp: , Rfl: ;  bisoprolol-hydrochlorothiazide (ZIAC) 2.5-6.25 MG per tablet, Take 1 tablet by mouth daily., Disp: 100 tablet, Rfl: 3;  colchicine 0.6 MG tablet, Take 1 tablet (0.6 mg total) by mouth 3 (three) times daily as needed., Disp: 10 tablet, Rfl: 2 atorvastatin (LIPITOR) 20 MG tablet, Take 1 tablet (20 mg total) by mouth at bedtime., Disp: 100 tablet, Rfl: 3;  cephALEXin (KEFLEX) 500 MG capsule, Take 500 mg by mouth 4 (four) times daily.  , Disp: , Rfl: ;  predniSONE (DELTASONE) 20 MG tablet, One tablet x 5 days or until your skin clears completely then, a half a tablet x 5 days, then half a tablet Monday, Wednesday,  Friday, for two months, Disp: 50 tablet, Rfl: 2  BP 153/79  Pulse 51  Resp 16  Ht 6' (1.829 m)  Wt 250 lb (113.399 kg)  BMI 33.91 kg/m2  Body mass index is 33.91 kg/(m^2).        Review of Systems he denies chest pain dyspnea on exertion PND orthopnea lower extremity claudication symptoms and neurologic symptoms suggestive of TIAs. All other systems are negative complete review of systems     Objective:   Physical Exam blood pressure 153/79 heart rate 51 respirations 16 Gen. he is alert and oriented x3 in no apparent distress Chest clear to auscultation no rhonchi or wheezing Cardiovascular exam reveals regular rhythm no murmurs carotid pulses 3+ no bruits Abdomen is soft nontender. Next repair of ventral hernia is intact. No pulsatile masses noted. 3+ femoral popliteal and dorsalis pedis pulses are palpable bilaterally.  Today I ordered a duplex scan of his abdominal aorta. There is no evidence of endoleak or enlargement of the iliac aneurysm albeit though the study is somewhat limited due to his body habitus.     Assessment:     Doing well post aortic stent graft repair in 2009 a right common iliac aneurysm status post open repair of ruptured abdominal aortic aneurysm    Plan:     Return  in one year with CT angiogram of abdomen and pelvis to continue to follow stent graft

## 2011-01-18 NOTE — Progress Notes (Signed)
Study performed 01/18/2011 [facility]

## 2011-02-01 NOTE — Procedures (Unsigned)
VASCULAR LAB EXAM  INDICATION:  Followup abdominal aortic aneurysm endograft placed on 11/30/2007.  HISTORY: Diabetes:  No. Cardiac:  No. Hypertension:  Yes.  EXAM:  AAA sac size 3.61 cm AP, 3.06 cm TRV. Previous sac size CT on 01/20/2011 3.0 cm AP, ____ cm TRV.  IMPRESSION: 1. Aorta and endograft appear patent. 2. No significant change in size of the aneurysmal sac surrounding the     endograft. 3. No evidence of endoleak was detected. 4. Technically difficult due to patient body habitus and acoustic     shadowing.      ___________________________________________ Quita Skye. Hart Rochester, M.D.  SH/MEDQ  D:  01/18/2011  T:  01/18/2011  Job:  [id number]

## 2011-02-17 LAB — BLOOD GAS, ARTERIAL
Acid-Base Excess: 1.6
Bicarbonate: 25.6 — ABNORMAL HIGH
Drawn by: 181601
FIO2: 0.21
O2 Saturation: 97.2
O2 Saturation: 96.2
Patient temperature: 98.6
TCO2: 26.8
pCO2 arterial: 39.3
pO2, Arterial: 87.2
pO2, Arterial: 79.9 — ABNORMAL LOW

## 2011-02-17 LAB — COMPREHENSIVE METABOLIC PANEL
ALT: 26
ALT: 41
ALT: 43
AST: 28
Alkaline Phosphatase: 43
Alkaline Phosphatase: 51
BUN: 10
BUN: 15
CO2: 24
CO2: 26
CO2: 27
CO2: 22
Calcium: 7.8 — ABNORMAL LOW
Chloride: 108
Chloride: 106
Creatinine, Ser: 0.66
Creatinine, Ser: 0.65
GFR calc Af Amer: >60
GFR calc non Af Amer: >60
GFR calc non Af Amer: >60
GFR calc non Af Amer: >60
GFR calc non Af Amer: >60
Glucose, Bld: 115 — ABNORMAL HIGH
Glucose, Bld: 133 — ABNORMAL HIGH
Glucose, Bld: 163 — ABNORMAL HIGH
Glucose, Bld: 100 — ABNORMAL HIGH
Potassium: 4
Potassium: 4.2
Sodium: 134 — ABNORMAL LOW
Sodium: 140
Total Bilirubin: 1.2
Total Bilirubin: 1.3 — ABNORMAL HIGH

## 2011-02-17 LAB — BASIC METABOLIC PANEL
CO2: 27
Calcium: 8.2 — ABNORMAL LOW
Chloride: 106
GFR calc Af Amer: >60
GFR calc Af Amer: >60
GFR calc non Af Amer: >60
Potassium: 3.9
Sodium: 139
Sodium: 138

## 2011-02-17 LAB — CBC
HCT: 36.8 — ABNORMAL LOW
HCT: 37.6 — ABNORMAL LOW
HCT: 42.6
HCT: 43.7
Hemoglobin: 12.8 — ABNORMAL LOW
Hemoglobin: 13.1
Hemoglobin: 14.7
Hemoglobin: 14.8
Hemoglobin: 12.9 — ABNORMAL LOW
Hemoglobin: 14.9
MCHC: 34.7
MCHC: 34.7
MCHC: 34.9
MCHC: 34.9
MCV: 94.7
MCV: 95.2
MCV: 95.4
MCV: 94.6
RBC: 3.87 — ABNORMAL LOW
RBC: 3.94 — ABNORMAL LOW
RBC: 4.44
RBC: 4.49
RBC: 3.87 — ABNORMAL LOW
RBC: 4.61
WBC: 6.3

## 2011-02-17 LAB — PROTIME-INR
INR: 1.1
INR: 1.1
Prothrombin Time: 14
Prothrombin Time: 14.5
Prothrombin Time: 14.5

## 2011-02-17 LAB — URINALYSIS, ROUTINE W REFLEX MICROSCOPIC
Bilirubin Urine: NEGATIVE
Bilirubin Urine: NEGATIVE
Glucose, UA: NEGATIVE
Hgb urine dipstick: NEGATIVE
Ketones, ur: NEGATIVE
Ketones, ur: NEGATIVE
Nitrite: NEGATIVE
Protein, ur: NEGATIVE
Protein, ur: NEGATIVE
pH: 5.5
pH: 5.5

## 2011-02-17 LAB — DIFFERENTIAL
Basophils Absolute: 0.1
Basophils Relative: 1
Lymphocytes Relative: 30
Monocytes Absolute: 0.6
Neutro Abs: 3.9
Neutrophils Relative %: 52

## 2011-02-17 LAB — CROSSMATCH

## 2011-02-17 LAB — APTT
aPTT: 31
aPTT: 29

## 2011-02-17 LAB — URINE MICROSCOPIC-ADD ON

## 2011-09-12 ENCOUNTER — Other Ambulatory Visit: Payer: Self-pay | Admitting: *Deleted

## 2011-09-12 DIAGNOSIS — I714 Abdominal aortic aneurysm, without rupture: Secondary | ICD-10-CM

## 2011-09-12 DIAGNOSIS — Z48812 Encounter for surgical aftercare following surgery on the circulatory system: Secondary | ICD-10-CM

## 2011-09-12 DIAGNOSIS — Z8679 Personal history of other diseases of the circulatory system: Secondary | ICD-10-CM

## 2011-12-17 ENCOUNTER — Other Ambulatory Visit: Payer: Self-pay | Admitting: Family Medicine

## 2011-12-20 ENCOUNTER — Telehealth: Payer: Self-pay | Admitting: Family Medicine

## 2011-12-20 MED ORDER — ALLOPURINOL 300 MG PO TABS: 300.0000 mg | ORAL_TABLET | Freq: Every day | ORAL | Status: DC

## 2011-12-20 MED ORDER — BISOPROLOL-HYDROCHLOROTHIAZIDE 2.5-6.25 MG PO TABS: 1.0000 | ORAL_TABLET | Freq: Every day | ORAL | Status: DC

## 2011-12-20 NOTE — Telephone Encounter (Signed)
Patient called stating that he need refills of his allopurinol and ziac called into [facility]. Please assist.

## 2012-01-05 DIAGNOSIS — D239 Other benign neoplasm of skin, unspecified: Secondary | ICD-10-CM | POA: Diagnosis not present

## 2012-01-05 DIAGNOSIS — L408 Other psoriasis: Secondary | ICD-10-CM | POA: Diagnosis not present

## 2012-01-05 DIAGNOSIS — D485 Neoplasm of uncertain behavior of skin: Secondary | ICD-10-CM | POA: Diagnosis not present

## 2012-01-05 DIAGNOSIS — L820 Inflamed seborrheic keratosis: Secondary | ICD-10-CM | POA: Diagnosis not present

## 2012-01-24 ENCOUNTER — Other Ambulatory Visit: Payer: Medicare Other

## 2012-01-24 ENCOUNTER — Ambulatory Visit: Payer: Medicare Other | Admitting: Vascular Surgery

## 2012-01-30 ENCOUNTER — Encounter: Payer: Self-pay | Admitting: Family Medicine

## 2012-01-30 ENCOUNTER — Ambulatory Visit (INDEPENDENT_AMBULATORY_CARE_PROVIDER_SITE_OTHER): Payer: Medicare Other | Admitting: Family Medicine

## 2012-01-30 VITALS — BP 124/84 | Temp 98.1°F | Ht 71.0 in | Wt 257.0 lb

## 2012-01-30 DIAGNOSIS — I10 Essential (primary) hypertension: Secondary | ICD-10-CM

## 2012-01-30 DIAGNOSIS — N401 Enlarged prostate with lower urinary tract symptoms: Secondary | ICD-10-CM | POA: Diagnosis not present

## 2012-01-30 DIAGNOSIS — L309 Dermatitis, unspecified: Secondary | ICD-10-CM

## 2012-01-30 DIAGNOSIS — Z23 Encounter for immunization: Secondary | ICD-10-CM | POA: Diagnosis not present

## 2012-01-30 DIAGNOSIS — L259 Unspecified contact dermatitis, unspecified cause: Secondary | ICD-10-CM | POA: Diagnosis not present

## 2012-01-30 DIAGNOSIS — Z0000 Encounter for general adult medical examination without abnormal findings: Secondary | ICD-10-CM

## 2012-01-30 DIAGNOSIS — Z9889 Other specified postprocedural states: Secondary | ICD-10-CM

## 2012-01-30 DIAGNOSIS — E785 Hyperlipidemia, unspecified: Secondary | ICD-10-CM | POA: Diagnosis not present

## 2012-01-30 DIAGNOSIS — M109 Gout, unspecified: Secondary | ICD-10-CM

## 2012-01-30 LAB — CBC WITH DIFFERENTIAL/PLATELET
Eosinophils Relative: 6.1 % — ABNORMAL HIGH (ref 0.0–5.0)
Monocytes Relative: 9.4 % (ref 3.0–12.0)
Neutrophils Relative %: 52.7 % (ref 43.0–77.0)
Platelets: 211 10*3/uL (ref 150.0–400.0)
WBC: 8.1 10*3/uL (ref 4.5–10.5)

## 2012-01-30 LAB — HEPATIC FUNCTION PANEL
ALT: 36 U/L (ref 0–53)
Total Protein: 7.1 g/dL (ref 6.0–8.3)

## 2012-01-30 LAB — LIPID PANEL
Cholesterol: 186 mg/dL (ref 0–200)
LDL Cholesterol: 103 mg/dL — ABNORMAL HIGH (ref 0–99)
Total CHOL/HDL Ratio: 4
VLDL: 32.2 mg/dL (ref 0.0–40.0)

## 2012-01-30 LAB — POCT URINALYSIS DIPSTICK
Bilirubin, UA: NEGATIVE
Ketones, UA: NEGATIVE
Leukocytes, UA: NEGATIVE
Protein, UA: NEGATIVE

## 2012-01-30 LAB — BASIC METABOLIC PANEL
Chloride: 102 mEq/L (ref 96–112)
Creatinine, Ser: 0.5 mg/dL (ref 0.4–1.5)

## 2012-01-30 LAB — PSA: PSA: 4.16 ng/mL — ABNORMAL HIGH (ref 0.10–4.00)

## 2012-01-30 LAB — TSH: TSH: 1.89 u[IU]/mL (ref 0.35–5.50)

## 2012-01-30 MED ORDER — BISOPROLOL-HYDROCHLOROTHIAZIDE 2.5-6.25 MG PO TABS: 1.0000 | ORAL_TABLET | Freq: Every day | ORAL | Status: DC

## 2012-01-30 MED ORDER — ATORVASTATIN CALCIUM 20 MG PO TABS: 20.0000 mg | ORAL_TABLET | Freq: Every day | ORAL | Status: DC

## 2012-01-30 MED ORDER — ALLOPURINOL 300 MG PO TABS: 300.0000 mg | ORAL_TABLET | Freq: Every day | ORAL | Status: DC

## 2012-01-30 MED ORDER — PREDNISONE 20 MG PO TABS: ORAL_TABLET | ORAL | Status: DC

## 2012-01-30 NOTE — Patient Instructions (Signed)
Continue your current medications  Followup in 1 year sooner if any problems  Strongly consider the shingles vaccine 

## 2012-01-30 NOTE — Progress Notes (Signed)
Subjective:    Patient ID: Adrian Reynolds, male    DOB: 08/14/1940, 71 y.o.   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 He's been to the dermatologist they've tried numerous lotions and potions and nothing really worked. The best thing is to take a short course of prednisone which she takes 2-3 times per year or for 10 days.  Tetanus 2006, Pneumovax x2, shingles declined, flu shot today,  He's due to go back and see Dr. Hart Rochester soon for evaluation of his AAA  Cognitive function normal he continues to work on a regular basis, home health safety reviewed no issues identified, no guns in the house, he does have a health care power of attorney and living well   Review of Systems  Constitutional: Negative.   HENT: Negative.   Eyes: Negative.   Respiratory: Negative.   Cardiovascular: Negative.   Gastrointestinal: Negative.   Genitourinary: Negative.   Musculoskeletal: Negative.   Skin: Negative.   Neurological: Negative.   Hematological: Negative.   Psychiatric/Behavioral: Negative.        Objective:   Physical Exam  Constitutional: He is oriented to person, place, and time. He appears well-developed and well-nourished.  HENT:  Head: Normocephalic and atraumatic.  Right Ear: External ear normal.  Left Ear: External ear normal.  Nose: Nose normal.  Mouth/Throat: Oropharynx is clear and moist.  Eyes: Conjunctivae and EOM are normal. Pupils are equal, round, and reactive to light. Right eye exhibits no discharge. Left eye exhibits no discharge.  Neck: Normal range of motion. Neck supple. No  JVD present. No tracheal deviation present. No thyromegaly present.  Cardiovascular: Normal rate, regular rhythm, normal heart sounds and intact distal pulses.  Exam reveals no gallop and no friction rub.   No murmur heard. Pulmonary/Chest: Effort normal and breath sounds normal. No stridor. No respiratory distress. He has no wheezes. He has no rales. He exhibits no tenderness.  Abdominal: Soft. Bowel sounds are normal. He exhibits no distension and no mass. There is no tenderness. There is no rebound and no guarding.       Scar in the midline from previous AAA surgery  Genitourinary: Rectum normal, prostate normal and penis normal. Guaiac negative stool. No penile tenderness.  Musculoskeletal: Normal range of motion. He exhibits no edema and no tenderness.  Lymphadenopathy:    He has no cervical adenopathy.  Neurological: He is alert and oriented to person, place, and time. He has normal reflexes. He displays normal reflexes. No cranial nerve deficit. He exhibits normal muscle tone. Coordination normal.  Skin: Skin is warm and dry. No rash noted. No erythema. No pallor.       Total body skin exam normal except for severe dyshidrotic eczema of his hands his skin is now cracking  Psychiatric: He has a normal mood and affect. His behavior is normal. Judgment and thought content normal.          Assessment & Plan:  Healthy male  History of gout continue allopurinol daily  Hyperlipidemia continue Lipitor for 20+ an aspirin tablet daily  Hypertension continue Ziac daily  Severe dyshidrotic eczema prednisone burst and taper  Status post AAA followup with Dr. Kipp Brood

## 2012-02-13 ENCOUNTER — Encounter: Payer: Self-pay | Admitting: Vascular Surgery

## 2012-02-13 ENCOUNTER — Ambulatory Visit: Payer: Medicare Other | Admitting: Surgery

## 2012-02-14 ENCOUNTER — Ambulatory Visit (INDEPENDENT_AMBULATORY_CARE_PROVIDER_SITE_OTHER): Payer: Medicare Other | Admitting: Vascular Surgery

## 2012-02-14 ENCOUNTER — Encounter: Payer: Self-pay | Admitting: Vascular Surgery

## 2012-02-14 ENCOUNTER — Ambulatory Visit
Admission: RE | Admit: 2012-02-14 | Discharge: 2012-02-14 | Disposition: A | Discharge status: Home / Self Care | Payer: Medicare Other | Source: Ambulatory Visit | Attending: Vascular Surgery | Admitting: Vascular Surgery

## 2012-02-14 VITALS — BP 136/81 | HR 51 | Resp 18 | Ht 72.0 in | Wt 254.0 lb

## 2012-02-14 DIAGNOSIS — Z48812 Encounter for surgical aftercare following surgery on the circulatory system: Secondary | ICD-10-CM

## 2012-02-14 DIAGNOSIS — I714 Abdominal aortic aneurysm, without rupture: Secondary | ICD-10-CM

## 2012-02-14 DIAGNOSIS — Z8679 Personal history of other diseases of the circulatory system: Secondary | ICD-10-CM

## 2012-02-14 MED ORDER — IOHEXOL 350 MG/ML SOLN
100.0000 mL | Freq: Once | INTRAVENOUS | Status: AC | PRN
  Administered 2012-02-14: 100 mL via INTRAVENOUS

## 2012-02-14 NOTE — Addendum Note (Signed)
Addended by: Sharee Pimple on: 02/14/2012 12:53 PM   Modules accepted: Orders

## 2012-02-14 NOTE — Progress Notes (Signed)
Subjective:     Patient ID: Adrian Reynolds, male   DOB: 1941-02-12, 71 y.o.   MRN: [id number]  HPI this 37-year-old male patient returns for continued followup regarding his abdominal aortic stent graft placement in 2009 for a right common iliac artery aneurysm. He had previously had open repair performed in [facility] 10 years earlier for a ruptured abdominal aortic aneurysm. He did require insertion of a lipoma Stant to the proximal area of the graft for some infolding and that has remained stable since that time. He denies any abdominal or back symptoms. He has been in good health since I last saw him one year ago. We obtained a duplex scan in the office one year ago which looked good.  Past Medical History  Diagnosis Date  . Hypertension   . Hyperlipidemia   . AAA (abdominal aortic aneurysm)   . Arthritis     left hip    History  Substance Use Topics  . Smoking status: Former Smoker    Types: Cigarettes    Quit date: 02/14/1991  . Smokeless tobacco: Never Used  . Alcohol Use: Yes     occasionally    Family History  Problem Relation Age of Onset  . Diabetes Father   . Stroke Father   . Coronary artery disease Father     No Known Allergies  Current outpatient prescriptions:allopurinol (ZYLOPRIM) 300 MG tablet, Take 1 tablet (300 mg total) by mouth daily., Disp: 100 tablet, Rfl: 3;  aspirin 81 MG tablet, Take 81 mg by mouth daily.  , Disp: , Rfl: ;  atorvastatin (LIPITOR) 20 MG tablet, Take 1 tablet (20 mg total) by mouth at bedtime., Disp: 100 tablet, Rfl: 3;  bisoprolol-hydrochlorothiazide (ZIAC) 2.5-6.25 MG per tablet, Take 1 tablet by mouth daily., Disp: 100 tablet, Rfl: 3 colchicine 0.6 MG tablet, Take 0.6 mg by mouth 3 (three) times daily as needed., Disp: , Rfl: ;  predniSONE (DELTASONE) 20 MG tablet, One tablet x 5 days or until your skin clears completely then, a half a tablet x 5 days, then half a tablet Monday, Wednesday, Friday, for two months, Disp:  50 tablet, Rfl: 2 No current facility-administered medications for this visit. Facility-Administered Medications Ordered in Other Visits: iohexol (OMNIPAQUE) 350 MG/ML injection 100 mL, 100 mL, Intravenous, Once PRN, Medication Radiologist, MD, 100 mL at 02/14/12 1045  BP 136/81  Pulse 51  Resp 18  Ht 6' (1.829 m)  Wt 254 lb (115.214 kg)  BMI 34.45 kg/m2  Body mass index is 34.45 kg/(m^2).           Review of Systems denies chest pain, dyspnea on exertion, PND, orthopnea, claudication, lateralizing weakness. Review of systems negative     Objective:   Physical Exam blood pressure 136/81 heart rate 51 respirations 18 Gen.-alert and oriented x3 in no apparent distress HEENT normal for age Lungs no rhonchi or wheezing Cardiovascular regular rhythm no murmurs carotid pulses 3+ palpable no bruits audible Abdomen soft nontender no palpable masses Musculoskeletal free of  major deformities Skin clear -no rashes Neurologic normal Lower extremities 3+ femoral and dorsalis pedis pulses palpable bilaterally with no edema  Today I ordered a CT angiogram which are reviewed by computer. There is no evidence of endoleak. The aneurysm stent graft is quite stable with no change over the past 3 CT angiograms dating back to 2010.      Assessment:     4 years status post insertion aortobiiliac stent graft 4 right common  iliac artery aneurysm-status post open repair for ruptured abdominal aortic aneurysm at [facility] 10 years earlier No evidence of endoleak and continued contraction of the aneurysm sac    Plan:     Return in one year for duplex scan of the abdominal aortic aneurysm in the office-currently doing well

## 2012-05-09 DIAGNOSIS — H2510 Age-related nuclear cataract, unspecified eye: Secondary | ICD-10-CM | POA: Diagnosis not present

## 2012-05-09 DIAGNOSIS — H5210 Myopia, unspecified eye: Secondary | ICD-10-CM | POA: Diagnosis not present

## 2012-05-09 DIAGNOSIS — H5200 Hypermetropia, unspecified eye: Secondary | ICD-10-CM | POA: Diagnosis not present

## 2012-12-26 DIAGNOSIS — L821 Other seborrheic keratosis: Secondary | ICD-10-CM | POA: Diagnosis not present

## 2012-12-26 DIAGNOSIS — L259 Unspecified contact dermatitis, unspecified cause: Secondary | ICD-10-CM | POA: Diagnosis not present

## 2012-12-26 DIAGNOSIS — D1801 Hemangioma of skin and subcutaneous tissue: Secondary | ICD-10-CM | POA: Diagnosis not present

## 2012-12-26 DIAGNOSIS — L919 Hypertrophic disorder of the skin, unspecified: Secondary | ICD-10-CM | POA: Diagnosis not present

## 2013-02-11 ENCOUNTER — Ambulatory Visit: Payer: Medicare Other | Admitting: Family

## 2013-02-12 ENCOUNTER — Ambulatory Visit: Payer: Medicare Other | Admitting: Vascular Surgery

## 2013-02-15 ENCOUNTER — Encounter: Payer: Self-pay | Admitting: Family

## 2013-02-18 ENCOUNTER — Ambulatory Visit (INDEPENDENT_AMBULATORY_CARE_PROVIDER_SITE_OTHER): Payer: Medicare Other | Admitting: Family

## 2013-02-18 ENCOUNTER — Ambulatory Visit (HOSPITAL_COMMUNITY)
Admission: RE | Admit: 2013-02-18 | Discharge: 2013-02-18 | Disposition: A | Discharge status: Home / Self Care | Payer: Medicare Other | Source: Ambulatory Visit | Attending: Vascular Surgery | Admitting: Vascular Surgery

## 2013-02-18 ENCOUNTER — Encounter: Payer: Self-pay | Admitting: Family

## 2013-02-18 VITALS — BP 150/75 | HR 46 | Resp 16 | Ht 72.0 in | Wt 250.0 lb

## 2013-02-18 DIAGNOSIS — I714 Abdominal aortic aneurysm, without rupture: Secondary | ICD-10-CM | POA: Diagnosis not present

## 2013-02-18 DIAGNOSIS — Z9889 Other specified postprocedural states: Secondary | ICD-10-CM

## 2013-02-18 DIAGNOSIS — Z8679 Personal history of other diseases of the circulatory system: Secondary | ICD-10-CM

## 2013-02-18 DIAGNOSIS — Z48812 Encounter for surgical aftercare following surgery on the circulatory system: Secondary | ICD-10-CM

## 2013-02-18 NOTE — Patient Instructions (Signed)
Abdominal Aortic Aneurysm  An aneurysm is the enlargement (dilatation), bulging, or ballooning out of part of the wall of a vein or artery. An aortic aneurysm is a bulging in the largest artery of the body. This artery supplies blood from the heart to the rest of the body.  The first part of the aorta is called the thoracic aorta. It leaves the heart, rises (ascends), arches, and goes down (descends) through the chest until it reaches the diaphragm. The diaphragm is the muscular part between the chest and abdomen.  The second part of the aorta is called the abdominal aorta after it has passed the diaphragm and continues down through the abdomen. The abdominal aorta ends where it splits to form the two iliac arteries that go to the legs. Aortic aneurysms can develop anywhere along the length of the aorta. The majority are located along the abdominal aorta. The major concern with an aortic aneurysm is that it can enlarge and rupture. This can cause death unless diagnosed and treated promptly. Aneurysms can also develop blood clots or infections. CAUSES  Many aortic aneurysms are caused by arteriosclerosis. Arteriosclerosis can weaken the aortic wall. The pressure of the blood being pumped through the aorta causes it to balloon out at the site of weakness. Therefore, high blood pressure (hypertension) is associated with aneurysm. Other risk factors include:  Age over 60.  Tobacco use.  Being male.  White race.  Family history of aneurysm.  Less frequent causes of abdominal aortic aneurysms include:  Connective tissue diseases.  Abdominal trauma.  Inflammation of blood vessles (arteritis).  Inherited (congenital) malformations.  Infection. SYMPTOMS  The signs and symptoms of an unruptured aneurysm will partly depend on its size and rate of growth.   Abdominal aortic aneurysms may cause pain. The pain typically has a deep quality as if it is piercing into the person. It is felt most  often in the lower back area. The pain is usually steady but may be relieved by changing your body position.  The person may also become aware of an abnormally prominent pulse in the belly (abdominal pulsation). DIAGNOSIS  An aortic aneurysm may be discovered by chance on physical exam, or on X-ray studies done for other reasons. It may be suspected because of other problems such as back or abdominal pain. The following tests may help identify the problem.  X-rays of the abdomen can show calcium deposits in the aneurysm wall.  CT scanning of the abdomen, particularly with contrast medium, is accurate at showing the exact size and shape of the aneurysm.  Ultrasounds give a clear picture of the size of an aneurysm (about 98% accuracy).  MRI scanning is accurate, but often unnecessary.  An abdominal angiogram shows the source of the major blood vessels arising from the aorta. It reveals the size and extent of any aneurysm. It can also show a clot clinging to the wall of the aneurysm (mural thrombus). TREATMENT  Treating an abdominal aortic aneurysm depends on the size. A rupture of an aneurysm is uncommon when they are less than 5 cm wide (2 inches). Rupture is far more common in aneurysms that are over 6 cm wide (2.4 inches).  Surgical repair is usually recommended for all aneurysms over 6 cm wide (2.4 inches). This depends on the health, age, and other circumstances of the individual. This type of surgery consists of opening the abdomen, removing the aneurysm, and sewing a synthetic graft (similar to a cloth tube) in its place. A   less invasive form of this surgery, using stent grafts, is sometimes recommended.  For most patients, elective repair is recommended for aneurysms between 4 and 6 cm (1.6 and 2.4 inches). Elective means the surgery can be done at your convenience. This should not be put off too long if surgery is recommended.  If you smoke, stop immediately. Smoking is a major risk  factor for enlargement and rupture.  Medications may be used to help decrease complications  these include medicine to lower blood pressure and control cholesterol. HOME CARE INSTRUCTIONS   If you smoke, stop. Do not start smoking.  Take all medications as prescribed.  Your caregiver will tell you when to have your aneurysm rechecked, either by ultrasound or CT scan.  If your caregiver has given you a follow-up appointment, it is very important to keep that appointment. Not keeping the appointment could result in a chronic or permanent injury, pain, or disability. If there is any problem keeping the appointment, you must call back to this facility for assistance. SEEK MEDICAL CARE IF:   You develop mild abdominal pain or pressure.  You are able to feel or perceive your aneurysm, and you sense any change. SEEK IMMEDIATE MEDICAL CARE IF:   You develop severe abdominal pain, or severe pain moving (radiating) to your back.  You suddenly develop cold or blue toes or feet.  You suddenly develop lightheadedness or fainting spells. MAKE SURE YOU:   Understand these instructions.  Will watch your condition.  Will get help right away if you are not doing well or get worse. Document Released: 02/16/2005 Document Revised: 08/01/2011 Document Reviewed: 12/11/2007 Exit[employer] Patient Information 2014 [employer].  

## 2013-02-18 NOTE — Progress Notes (Signed)
[facility]  Established [facility]  History of Present Illness  Adrian Reynolds is a 72 y.o. (02/13/1941) male patient of Dr. Hart [place] who returns for continued followup regarding his abdominal aortic stent graft placement in 2009 for a right common iliac artery aneurysm. He had previously had open repair performed in [facility] 20 years ago for a ruptured abdominal aortic aneurysm. He did require insertion of a stent to the proximal area of the graft for some infolding.   The patient has not had back or abdominal pain. He denies any stroke or TIA symptoms, denies claudication. Pt Diabetic: No Pt smoker: former smoker, quit 20 years ago   Past Medical History  Diagnosis Date  . Hypertension   . Hyperlipidemia   . AAA (abdominal aortic aneurysm)   . Arthritis     left hip   Past Surgical History  Procedure Laterality Date  . Abdominal aortic aneurysm repair    . Hernia repair      Mesh ventral   Social History History  Substance Use Topics  . Smoking status: Former Smoker    Types: Cigarettes    Quit date: 02/14/1991  . Smokeless tobacco: Never Used  . Alcohol Use: Yes     Comment: occasionally   Family History Family History  Problem Relation Age of Onset  . Diabetes Father   . Stroke Father   . Coronary artery disease Father    Current Outpatient Prescriptions on File Prior to Visit  Medication Sig Dispense Refill  . allopurinol (ZYLOPRIM) 300 MG tablet Take 1 tablet (300 mg total) by mouth daily.  100 tablet  3  . aspirin 81 MG tablet Take 81 mg by mouth daily.        Marland Kitchen atorvastatin (LIPITOR) 20 MG tablet Take 1 tablet (20 mg total) by mouth at bedtime.  100 tablet  3  . bisoprolol-hydrochlorothiazide (ZIAC) 2.5-6.25 MG per tablet Take 1 tablet by mouth daily.  100 tablet  3  . colchicine 0.6 MG tablet Take 0.6 mg by mouth 3 (three) times daily as needed.      . predniSONE (DELTASONE) 20 MG tablet One tablet x 5 days or  until your skin clears completely then, a half a tablet x 5 days, then half a tablet Monday, Wednesday, Friday, for two months  50 tablet  2   No current facility-administered medications on file prior to visit.   No Known Allergies   ROS: [x]  Positive   [ ]  Negative   [ ]  All sytems reviewed and are negative  General: [ ]  Weight loss, [ ]  Fever, [ ]  chills Neurologic: [ ]  Dizziness, [ ]  Blackouts, [ ]  Seizure [ ]  Stroke, [ ]  "Mini stroke", [ ]  Slurred speech, [ ]  Temporary blindness; [ ]  weakness in arms or legs, [ ]  Hoarseness Cardiac: [ ]  Chest pain/pressure, [ ]  Shortness of breath at rest [ ]  Shortness of breath with exertion, [ ]  Atrial fibrillation or irregular heartbeat Vascular: [ ]  Pain in legs with walking, [ ]  Pain in legs at rest, [ ]  Pain in legs at night,  [ ]  Non-healing ulcer, [ ]  Blood clot in vein/DVT,   Pulmonary: [ ]  Home oxygen, [ ]  Productive cough, [ ]  Coughing up blood, [ ]  Asthma,  [ ]  Wheezing Musculoskeletal:  [ ]  Arthritis, [ ]  Low back pain, [ ]  Joint pain Hematologic: [ ]  Easy Bruising, [ ]  Anemia; [ ]  Hepatitis Gastrointestinal: [ ]   Blood in stool, [ ]  Gastroesophageal Reflux/heartburn, [ ]  Trouble swallowing Urinary: [ ]  chronic Kidney disease, [ ]  on HD - [ ]  MWF or [ ]  TTHS, [ ]  Burning with urination, [ ]  Difficulty urinating Skin: [ ]  Rashes, [ ]  Wounds Psychological: [ ]  Anxiety, [ ]  Depression  Physical Examination  Filed Vitals:   02/18/13 0940  BP: 150/75  Pulse: 46  Resp: 16   Filed Weights   02/18/13 0940  Weight: 250 lb (113.399 kg)   Body mass index is 33.9 kg/(m^2).  General: A&O x 3, WD, Obese.   Pulmonary: Sym exp, good air movt, CTAB, no rales, rhonchi, or wheezing.  Cardiac: RRR, Nl S1, S2, no Murmurs, rubs or gallops.  Vascular: Vessel Right Left  Radial Palpable Palpable  Brachial Palpable Palpable  Carotid audible, withoutout bruit audible, without bruit  Aorta Not palpable N/A  Femoral Palpable Palpable   Popliteal Not palpable Not palpable  PT Palpable Palpable  DP Palpable Palpable   Gastrointestinal: soft, NTND, -G/R, - HSM, - masses, - CVAT B.  Musculoskeletal: M/S 5/5 throughout, Extremities without ischemic changes.  Neurologic: Pain and light touch intact in extremities, Motor exam as listed above. CN 2-12 intact.  Non-Invasive Vascular Imaging  EVAR Duplex (Date: 02/18/2013)  AAA sac size: 3.34 cm x 3.4 cm  no endoleak detected  CTA Abd/Pelvis Duplex (Date: 02/14/12)  AAA sac size: 4.2 cm  IMPRESSION:  1. Minimal increase in fusiform aneurysmal dilatation of the  superior aspect of the mid abdominal aorta (as measured just  cranial to the takeoff of the right renal artery), now measuring  approximately 4.2 cm in greatest oblique coronal dimension,  previously, 3.8 cm.  2. 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  3. Stable sequela of endovascular repair of previously identified  right common iliac artery aneurysm including additional stent  placement within the proximal landing site of the stent graft. No  endoleak.  4. Complete thrombosis and involution of previously identified  right common iliac artery aneurysm. 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  5. Unchanged mild fusiform ectasia of the left common and internal  iliac arteries.    Medical Decision Making  Adrian Reynolds is a 72 y.o. male who presents s/p EVAR.  Pt is asymptomatic with decrease in sac size.  I discussed with the patient the importance of surveillance of the endograft.  The next endograft duplex will be scheduled for 12 months.  The patient will follow up with [place] in 12 months with these studies.  I emphasized the importance of maximal medical management  including strict control of blood pressure, blood glucose, and lipid levels, antiplatelet agents, obtaining regular exercise, and continued cessation of smoking.   The patient was given information about AAA including signs, symptoms, treatment, and how to minimize the risk of enlargement and rupture of aneurysms.  The patient was also given information about signs and symptoms that should prompt him to call 911.    Thank you for allowing [place] to participate in this patient's care.  Charisse March, RN, MSN, FNP-C [facility] Office: [phone]  Clinic Physician: Myra Gianotti  02/18/2013, 9:08 AM

## 2013-02-20 NOTE — Addendum Note (Signed)
Addended by: Sharee Pimple on: 02/20/2013 12:27 PM   Modules accepted: Orders

## 2013-03-05 DIAGNOSIS — H52209 Unspecified astigmatism, unspecified eye: Secondary | ICD-10-CM | POA: Diagnosis not present

## 2013-03-05 DIAGNOSIS — H2510 Age-related nuclear cataract, unspecified eye: Secondary | ICD-10-CM | POA: Diagnosis not present

## 2013-03-15 DIAGNOSIS — Z23 Encounter for immunization: Secondary | ICD-10-CM | POA: Diagnosis not present

## 2013-04-03 ENCOUNTER — Encounter: Payer: Self-pay | Admitting: Family Medicine

## 2013-04-03 ENCOUNTER — Ambulatory Visit (INDEPENDENT_AMBULATORY_CARE_PROVIDER_SITE_OTHER): Payer: Medicare Other | Admitting: Family Medicine

## 2013-04-03 VITALS — BP 130/80 | Temp 98.2°F | Wt 250.0 lb

## 2013-04-03 DIAGNOSIS — I10 Essential (primary) hypertension: Secondary | ICD-10-CM | POA: Diagnosis not present

## 2013-04-03 DIAGNOSIS — Z125 Encounter for screening for malignant neoplasm of prostate: Secondary | ICD-10-CM | POA: Diagnosis not present

## 2013-04-03 DIAGNOSIS — L259 Unspecified contact dermatitis, unspecified cause: Secondary | ICD-10-CM | POA: Diagnosis not present

## 2013-04-03 DIAGNOSIS — M109 Gout, unspecified: Secondary | ICD-10-CM | POA: Diagnosis not present

## 2013-04-03 DIAGNOSIS — Z9889 Other specified postprocedural states: Secondary | ICD-10-CM | POA: Diagnosis not present

## 2013-04-03 DIAGNOSIS — L309 Dermatitis, unspecified: Secondary | ICD-10-CM

## 2013-04-03 DIAGNOSIS — E785 Hyperlipidemia, unspecified: Secondary | ICD-10-CM | POA: Diagnosis not present

## 2013-04-03 LAB — CBC WITH DIFFERENTIAL/PLATELET
Basophils Absolute: 0 10*3/uL (ref 0.0–0.1)
Eosinophils Absolute: 0.6 10*3/uL (ref 0.0–0.7)
HCT: 46.1 % (ref 39.0–52.0)
Lymphs Abs: 2.4 10*3/uL (ref 0.7–4.0)
MCHC: 33.6 g/dL (ref 30.0–36.0)
MCV: 96.6 fl (ref 78.0–100.0)
Monocytes Absolute: 0.6 10*3/uL (ref 0.1–1.0)
Platelets: 215 10*3/uL (ref 150.0–400.0)
RDW: 13.5 % (ref 11.5–14.6)

## 2013-04-03 LAB — HEPATIC FUNCTION PANEL
ALT: 35 U/L (ref 0–53)
AST: 28 U/L (ref 0–37)
Bilirubin, Direct: 0.2 mg/dL (ref 0.0–0.3)
Total Bilirubin: 1.4 mg/dL — ABNORMAL HIGH (ref 0.3–1.2)
Total Protein: 6.9 g/dL (ref 6.0–8.3)

## 2013-04-03 LAB — POCT URINALYSIS DIPSTICK
Ketones, UA: NEGATIVE
Leukocytes, UA: NEGATIVE
Nitrite, UA: NEGATIVE
Protein, UA: NEGATIVE
Urobilinogen, UA: 0.2
pH, UA: 5.5

## 2013-04-03 LAB — LIPID PANEL: Cholesterol: 153 mg/dL (ref 0–200)

## 2013-04-03 LAB — BASIC METABOLIC PANEL
BUN: 15 mg/dL (ref 6–23)
Chloride: 102 mEq/L (ref 96–112)
Creatinine, Ser: 0.7 mg/dL (ref 0.4–1.5)

## 2013-04-03 MED ORDER — ALLOPURINOL 300 MG PO TABS: 300.0000 mg | ORAL_TABLET | Freq: Every day | ORAL | Status: DC

## 2013-04-03 MED ORDER — ATENOLOL-CHLORTHALIDONE 100-25 MG PO TABS: ORAL_TABLET | ORAL | Status: DC

## 2013-04-03 MED ORDER — ATORVASTATIN CALCIUM 20 MG PO TABS: 20.0000 mg | ORAL_TABLET | Freq: Every day | ORAL | Status: DC

## 2013-04-03 MED ORDER — PREDNISONE 20 MG PO TABS: ORAL_TABLET | ORAL | Status: DC

## 2013-04-03 NOTE — Progress Notes (Signed)
Pre visit review using our clinic review tool, if applicable. No additional management support is needed unless otherwise documented below in the visit note. 

## 2013-04-03 NOTE — Patient Instructions (Signed)
Continue your current medications except stop the ziac            and begin Tenoretic one half tab every morning  Followup in 1 year sooner if any problems  Walk 30 minutes daily  Prednisone 20 mg ........ uses outlined for a flare of her eczema

## 2013-04-03 NOTE — Progress Notes (Signed)
  Subjective:    Patient ID: Adrian Reynolds, male    DOB: March 12, 1941, 72 y.o.   MRN: [id number]  HPI Keison is a 72 year old married male nonsmoker who comes in today for a Medicare wellness examination because of a history of gout, hyperlipidemia, hypertension, eczema on his hands that is unresolved with numerous medications lotions and potions, history of AAA rupture currently asymptomatic recent ultrasound normal  Cognitive function normal he walks on a regular basis home health safety reviewed no issues identified, no guns in the house, he does have a health care power of attorney and living will  He gets routine eye care, dental care, colonoscopy and GI, vaccinations up-to-date   Review of Systems  Constitutional: Negative.   HENT: Negative.   Eyes: Negative.   Respiratory: Negative.   Cardiovascular: Negative.   Gastrointestinal: Negative.   Endocrine: Negative.   Genitourinary: Negative.   Musculoskeletal: Negative.   Skin: Negative.   Allergic/Immunologic: Negative.   Neurological: Negative.   Hematological: Negative.   Psychiatric/Behavioral: Negative.        Objective:   Physical Exam  Nursing note and vitals reviewed. Constitutional: He is oriented to person, place, and time. He appears well-developed and well-nourished.  HENT:  Head: Normocephalic and atraumatic.  Right Ear: External ear normal.  Left Ear: External ear normal.  Nose: Nose normal.  Mouth/Throat: Oropharynx is clear and moist.  Cerumen impaction right ear removed by flushing and irrigation  Eyes: Conjunctivae and EOM are normal. Pupils are equal, round, and reactive to light.  Neck: Normal range of motion. Neck supple. No JVD present. No tracheal deviation present. No thyromegaly present.  Cardiovascular: Normal rate, regular rhythm, normal heart sounds and intact distal pulses.  Exam reveals no gallop and no friction rub.   No murmur heard. No carotid or bruits peripheral pulses 2+ and  symmetrical  Pulmonary/Chest: Effort normal and breath sounds normal. No stridor. No respiratory distress. He has no wheezes. He has no rales. He exhibits no tenderness.  Abdominal: Soft. Bowel sounds are normal. He exhibits no distension and no mass. There is no tenderness. There is no rebound and no guarding.  Genitourinary: Rectum normal, prostate normal and penis normal. Guaiac negative stool. No penile tenderness.  Musculoskeletal: Normal range of motion. He exhibits no edema and no tenderness.  Lymphadenopathy:    He has no cervical adenopathy.  Neurological: He is alert and oriented to person, place, and time. He has normal reflexes. No cranial nerve deficit. He exhibits normal muscle tone.  Skin: Skin is warm and dry. No rash noted. No erythema. No pallor.  Total body skin exam normal except  for eczema on his hands  Psychiatric: He has a normal mood and affect. His behavior is normal. Judgment and thought content normal.          Assessment & Plan:  Healthy male  Hypertension at goal continue current therapy except switch to Tenoretic since his insurance is no longer cover Ziac  History of gout continue allopurinol  History of hyperlipidemia continue Lipitor and an aspirin tablet daily  Eczema on his hands continue home therapy prednisone when he has a severe flare  Status post AAA repair yearly followup in vascular

## 2013-04-10 ENCOUNTER — Telehealth: Payer: Self-pay | Admitting: Family Medicine

## 2013-04-10 DIAGNOSIS — L408 Other psoriasis: Secondary | ICD-10-CM | POA: Diagnosis not present

## 2013-04-10 DIAGNOSIS — L259 Unspecified contact dermatitis, unspecified cause: Secondary | ICD-10-CM | POA: Diagnosis not present

## 2013-04-10 NOTE — Telephone Encounter (Signed)
Returning your call. °

## 2013-04-12 NOTE — Telephone Encounter (Signed)
Patient is aware of lab results and copy e faxed to Dr Amy [place]

## 2013-09-24 DIAGNOSIS — H66009 Acute suppurative otitis media without spontaneous rupture of ear drum, unspecified ear: Secondary | ICD-10-CM | POA: Diagnosis not present

## 2014-02-17 ENCOUNTER — Encounter: Payer: Self-pay | Admitting: Family

## 2014-02-18 ENCOUNTER — Ambulatory Visit (INDEPENDENT_AMBULATORY_CARE_PROVIDER_SITE_OTHER): Payer: Medicare Other | Admitting: Family

## 2014-02-18 ENCOUNTER — Encounter: Payer: Self-pay | Admitting: Family

## 2014-02-18 ENCOUNTER — Ambulatory Visit (HOSPITAL_COMMUNITY)
Admission: RE | Admit: 2014-02-18 | Discharge: 2014-02-18 | Disposition: A | Discharge status: Home / Self Care | Payer: Medicare Other | Source: Ambulatory Visit | Attending: Family | Admitting: Family

## 2014-02-18 VITALS — BP 142/72 | HR 47 | Resp 14 | Ht 71.0 in | Wt 236.0 lb

## 2014-02-18 DIAGNOSIS — I723 Aneurysm of iliac artery: Secondary | ICD-10-CM | POA: Insufficient documentation

## 2014-02-18 DIAGNOSIS — Z9889 Other specified postprocedural states: Secondary | ICD-10-CM

## 2014-02-18 DIAGNOSIS — I714 Abdominal aortic aneurysm, without rupture, unspecified: Secondary | ICD-10-CM | POA: Insufficient documentation

## 2014-02-18 DIAGNOSIS — Z8679 Personal history of other diseases of the circulatory system: Secondary | ICD-10-CM

## 2014-02-18 NOTE — Patient Instructions (Signed)
Abdominal Aortic Aneurysm An aneurysm is a weakened or damaged part of an artery wall that bulges from the normal force of blood pumping through the body. An abdominal aortic aneurysm is an aneurysm that occurs in the lower part of the aorta, the main artery of the body.  The major concern with an abdominal aortic aneurysm is that it can enlarge and burst (rupture) or blood can flow between the layers of the wall of the aorta through a tear (aorticdissection). Both of these conditions can cause bleeding inside the body and can be life threatening unless diagnosed and treated promptly. CAUSES  The exact cause of an abdominal aortic aneurysm is unknown. Some contributing factors are:   A hardening of the arteries caused by the buildup of fat and other substances in the lining of a blood vessel (arteriosclerosis).  Inflammation of the walls of an artery (arteritis).   Connective tissue diseases, such as Marfan syndrome.   Abdominal trauma.   An infection, such as syphilis or staphylococcus, in the wall of the aorta (infectious aortitis) caused by bacteria. RISK FACTORS  Risk factors that contribute to an abdominal aortic aneurysm may include:  Age older than 60 years.   High blood pressure (hypertension).  Male gender.  Ethnicity (white race).  Obesity.  Family history of aneurysm (first degree relatives only).  Tobacco use. PREVENTION  The following healthy lifestyle habits may help decrease your risk of abdominal aortic aneurysm:  Quitting smoking. Smoking can raise your blood pressure and cause arteriosclerosis.  Limiting or avoiding alcohol.  Keeping your blood pressure, blood sugar level, and cholesterol levels within normal limits.  Decreasing your salt intake. In somepeople, too much salt can raise blood pressure and increase your risk of abdominal aortic aneurysm.  Eating a diet low in saturated fats and cholesterol.  Increasing your fiber intake by including  whole grains, vegetables, and fruits in your diet. Eating these foods may help lower blood pressure.  Maintaining a healthy weight.  Staying physically active and exercising regularly. SYMPTOMS  The symptoms of abdominal aortic aneurysm may vary depending on the size and rate of growth of the aneurysm.Most grow slowly and do not have any symptoms. When symptoms do occur, they may include:  Pain (abdomen, side, lower back, or groin). The pain may vary in intensity. A sudden onset of severe pain may indicate that the aneurysm has ruptured.  Feeling full after eating only small amounts of food.  Nausea or vomiting or both.  Feeling a pulsating lump in the abdomen.  Feeling faint or passing out. DIAGNOSIS  Since most unruptured abdominal aortic aneurysms have no symptoms, they are often discovered during diagnostic exams for other conditions. An aneurysm may be found during the following procedures:  Ultrasonography (A one-time screening for abdominal aortic aneurysm by ultrasonography is also recommended for all men aged 65-75 years who have ever smoked).  X-ray exams.  A computed tomography (CT).  Magnetic resonance imaging (MRI).  Angiography or arteriography. TREATMENT  Treatment of an abdominal aortic aneurysm depends on the size of your aneurysm, your age, and risk factors for rupture. Medication to control blood pressure and pain may be used to manage aneurysms smaller than 6 cm. Regular monitoring for enlargement may be recommended by your caregiver if:  The aneurysm is 3-4 cm in size (an annual ultrasonography may be recommended).  The aneurysm is 4-4.5 cm in size (an ultrasonography every 6 months may be recommended).  The aneurysm is larger than 4.5 cm in   size (your caregiver may ask that you be examined by a vascular surgeon). If your aneurysm is larger than 6 cm, surgical repair may be recommended. There are two main methods for repair of an aneurysm:   Endovascular  repair (a minimally invasive surgery). This is done most often.  Open repair. This method is used if an endovascular repair is not possible. Document Released: 02/16/2005 Document Revised: 09/03/2012 Document Reviewed: 06/08/2012 Exit[employer] Patient Information 2015 [employer]. This information is not intended to replace advice given to you by your health care provider. Make sure you discuss any questions you have with your health care provider.  

## 2014-02-18 NOTE — Progress Notes (Addendum)
[facility] [place]  Established EVAR  History of Present Illness  Adrian Reynolds is a 73 y.o. (June 14, 1940) male patient of Dr. Kellie Simmering who returns for continued followup regarding his abdominal aortic stent graft placement in 2009 for a right common iliac artery aneurysm. He had previously had open repair performed in [facility] 20 years ago for a ruptured abdominal aortic aneurysm. He did require insertion of a stent to the proximal area of the graft for some infolding.  The patient has not had back or abdominal pain.  He denies any stroke or TIA symptoms, denies claudication.  Pt Diabetic: No  Pt smoker: former smoker, quit 20 years ago    Past Medical History  Diagnosis Date  . Hypertension   . Hyperlipidemia   . AAA (abdominal aortic aneurysm)   . Arthritis     left hip  . Iliac artery aneurysm 2009   Past Surgical History  Procedure Laterality Date  . Abdominal aortic aneurysm repair    . Hernia repair      Mesh ventral   Social History History  Substance Use Topics  . Smoking status: Former Smoker    Types: Cigarettes    Quit date: 02/14/1991  . Smokeless tobacco: Never Used  . Alcohol Use: Yes     Comment: occasionally   Family History Family History  Problem Relation Age of Onset  . Diabetes Father   . Stroke Father   . Coronary artery disease Father    Current Outpatient Prescriptions on File Prior to Visit  Medication Sig Dispense Refill  . allopurinol (ZYLOPRIM) 300 MG tablet Take 1 tablet (300 mg total) by mouth daily.  100 tablet  3  . aspirin 81 MG tablet Take 81 mg by mouth daily.        Marland Kitchen atenolol-chlorthalidone (TENORETIC 100) 100-25 MG per tablet One half tab every morning  50 tablet  3  . atorvastatin (LIPITOR) 20 MG tablet Take 1 tablet (20 mg total) by mouth at bedtime.  100 tablet  3  . predniSONE (DELTASONE) 20 MG tablet One tablet x 5 days or until your skin clears completely then, a half a tablet x  5 days, then half a tablet Monday, Wednesday, Friday, for two months  50 tablet  2   No current facility-administered medications on file prior to visit.   No Known Allergies   ROS: See HPI for pertinent positives and negatives.  Physical Examination  Filed Vitals:   02/18/14 1113  BP: 142/72  Pulse: 47  Resp: 14  Height: 5\' 11"  (1.803 m)  Weight: 236 lb (107.049 kg)  SpO2: 97%   Body mass index is 32.93 kg/(m^2).  General: A&O x 3, WD, Obese.  Pulmonary: Sym exp, good air movt, CTAB, no rales, rhonchi, or wheezing.  Cardiac: RRR, Nl S1, S2, no Murmurs, rubs or gallops.  Vascular:  Vessel  Right  Left   Radial  Palpable  Palpable   Brachial  Palpable  Palpable   Carotid  audible, withoutout bruit  audible, without bruit   Aorta  Not palpable  N/A   Femoral  Palpable  Palpable   Popliteal  Not palpable  Not palpable   PT  Palpable  Palpable   DP  Palpable  Palpable    Gastrointestinal: soft, NTND, -G/R, - HSM, - masses, - CVAT B.  Musculoskeletal: M/S 5/5 throughout, Extremities without ischemic changes.  Neurologic: Pain and light touch intact in extremities, Motor exam as  listed above. CN 2-12 intact.     Non-Invasive Vascular Imaging  EVAR Duplex (Date: 02/18/2014) ABDOMINAL AORTA DUPLEX EVALUATION - POST ENDOVASCULAR REPAIR    INDICATION: Follow up endovascular aortic repair with stent graft of right iliac aneurysm.    PREVIOUS INTERVENTION(S): Open repair of abdominal aortic aneurysm with straight graft  > than 10 years ago. Insertion of aortic stent graft for right iliac aneurysm 2009    DUPLEX EXAM:      DIAMETER AP (cm) DIAMETER TRANSVERSE (cm) VELOCITIES (cm/sec)  Aorta 3.4 - 86  Right Common Iliac 1.4 1.6 110  Left Common Iliac 1.8 1.7 90    Comparison Study       Date DIAMETER AP (cm) DIAMETER TRANSVERSE (cm)  02/18/2013 3.3 3.4     ADDITIONAL FINDINGS:     IMPRESSION: 1. Technically difficult exam due to body habitus and excessive bowel  gas. 2.  Widely patent aortic stent graft with residual sac measuring 3.4 cms. 3. The right iliac stent appears widely patent. 4. Stable left iliac artery measurement.    Compared to the previous exam:  No significant change.     CTA Abd/Pelvis Duplex (Date: 02/14/12)  AAA sac size: 4.2 cm  IMPRESSION:  1. Minimal increase in fusiform aneurysmal dilatation of the  superior aspect of the mid abdominal aorta (as measured just  cranial to the takeoff of the right renal artery), now measuring  approximately 4.2 cm in greatest oblique coronal dimension,  previously, 3.8 cm.  2. 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  3. Stable sequela of endovascular repair of previously identified  right common iliac artery aneurysm including additional stent  placement within the proximal landing site of the stent graft. No  endoleak.  4. Complete thrombosis and involution of previously identified  right common iliac artery aneurysm. 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  5. Unchanged mild fusiform ectasia of the left common and internal  iliac arteries.   Medical Decision Making  Adrian Reynolds is a 73 y.o. male who presents s/p EVAR (Date: 2009).  Pt is asymptomatic with decreased sac size.  I discussed with the patient the importance of surveillance of the endograft.  I reviewed his results and exam with Dr. Kellie Simmering  The next endograft CTA will be in one year, follow up with Dr. Kellie Simmering at that time.     I emphasized the importance of maximal medical management including strict control of blood pressure, blood glucose, and lipid levels, antiplatelet agents, obtaining regular exercise, and cessation of smoking.   The patient was given information about AAA including  signs, symptoms, treatment, and how to minimize the risk of enlargement and rupture of aneurysms.    Thank you for allowing [place] to participate in this patient's care.  Clemon Chambers, RN, MSN, FNP-C [facility] [place] Office: [phone]  Clinic Physician: Early  02/18/2014, 11:23 AM

## 2014-02-18 NOTE — Addendum Note (Signed)
Addended by: Dorthula Rue L on: 02/18/2014 04:49 PM   Modules accepted: Orders

## 2014-02-21 ENCOUNTER — Telehealth: Payer: Self-pay | Admitting: Vascular Surgery

## 2014-02-21 ENCOUNTER — Other Ambulatory Visit: Payer: Self-pay

## 2014-02-21 DIAGNOSIS — I714 Abdominal aortic aneurysm, without rupture, unspecified: Secondary | ICD-10-CM

## 2014-02-21 DIAGNOSIS — Z48812 Encounter for surgical aftercare following surgery on the circulatory system: Secondary | ICD-10-CM

## 2014-02-21 NOTE — Telephone Encounter (Signed)
Cancelled the EVAR duplex and NP visit for 03/03/2015.  Voicemail at patients home # that the office would be calling to arrange the CT scan instead.

## 2014-02-21 NOTE — Telephone Encounter (Signed)
Message copied by Rufina Falco on Fri Feb 21, 2014 11:29 AM ------      Message from: Viann Fish      Created: Thu Feb 20, 2014  6:15 PM      Regarding: FW: follow up       Please cancel the AA post EVAR Duplex for a year from now.      Please call patient and advise him that this is what Dr. Kellie Simmering prefers, and please schedule him for CTA abd/pelvis post EVAR for a year from now.      Thank you,      Vinnie Level            ----- Message -----         From: Mal Misty, MD         Sent: 02/20/2014   9:35 AM           To: Sharmon Leyden Nickel, NP      Subject: RE: follow up                                            He has had him return in one year to see me with CT angiogram      JDL      ----- Message -----         From: Viann Fish, NP         Sent: 02/18/2014  11:44 AM           To: Mal Misty, MD      Subject: follow up                                                Dr. Kellie Simmering,            Pt wants to know if you want to get a CTA of his aortic graft stent from 2009.      Last CTA was in 2013.      I have scheduled him to return in a year for AAA post EVAR Duplex.            Thank you,      Vinnie Level             ------

## 2014-02-28 DIAGNOSIS — T18100A Unspecified foreign body in esophagus causing compression of trachea, initial encounter: Secondary | ICD-10-CM | POA: Diagnosis not present

## 2014-03-10 DIAGNOSIS — H52202 Unspecified astigmatism, left eye: Secondary | ICD-10-CM | POA: Diagnosis not present

## 2014-03-10 DIAGNOSIS — H2513 Age-related nuclear cataract, bilateral: Secondary | ICD-10-CM | POA: Diagnosis not present

## 2014-05-28 ENCOUNTER — Ambulatory Visit (INDEPENDENT_AMBULATORY_CARE_PROVIDER_SITE_OTHER): Payer: Medicare Other | Admitting: Family Medicine

## 2014-05-28 ENCOUNTER — Encounter: Payer: Self-pay | Admitting: Family Medicine

## 2014-05-28 VITALS — BP 140/80 | Temp 97.8°F | Ht 70.75 in | Wt 231.0 lb

## 2014-05-28 DIAGNOSIS — E785 Hyperlipidemia, unspecified: Secondary | ICD-10-CM

## 2014-05-28 DIAGNOSIS — R351 Nocturia: Secondary | ICD-10-CM

## 2014-05-28 DIAGNOSIS — M1A0790 Idiopathic chronic gout, unspecified ankle and foot, without tophus (tophi): Secondary | ICD-10-CM

## 2014-05-28 DIAGNOSIS — I10 Essential (primary) hypertension: Secondary | ICD-10-CM | POA: Diagnosis not present

## 2014-05-28 DIAGNOSIS — Z125 Encounter for screening for malignant neoplasm of prostate: Secondary | ICD-10-CM

## 2014-05-28 DIAGNOSIS — M10079 Idiopathic gout, unspecified ankle and foot: Secondary | ICD-10-CM

## 2014-05-28 DIAGNOSIS — Z23 Encounter for immunization: Secondary | ICD-10-CM

## 2014-05-28 DIAGNOSIS — N401 Enlarged prostate with lower urinary tract symptoms: Secondary | ICD-10-CM

## 2014-05-28 DIAGNOSIS — L309 Dermatitis, unspecified: Secondary | ICD-10-CM | POA: Diagnosis not present

## 2014-05-28 DIAGNOSIS — M109 Gout, unspecified: Secondary | ICD-10-CM

## 2014-05-28 LAB — CBC WITH DIFFERENTIAL/PLATELET
Basophils Absolute: 0 10*3/uL (ref 0.0–0.1)
Basophils Relative: 0.5 % (ref 0.0–3.0)
Eosinophils Absolute: 0.7 10*3/uL (ref 0.0–0.7)
Eosinophils Relative: 8.4 % — ABNORMAL HIGH (ref 0.0–5.0)
HEMATOCRIT: 50.4 % (ref 39.0–52.0)
HEMOGLOBIN: 16.5 g/dL (ref 13.0–17.0)
LYMPHS ABS: 2.2 10*3/uL (ref 0.7–4.0)
Lymphocytes Relative: 27 % (ref 12.0–46.0)
MCHC: 32.7 g/dL (ref 30.0–36.0)
MCV: 98.9 fl (ref 78.0–100.0)
Monocytes Absolute: 0.7 10*3/uL (ref 0.1–1.0)
Monocytes Relative: 9 % (ref 3.0–12.0)
Neutro Abs: 4.5 10*3/uL (ref 1.4–7.7)
Neutrophils Relative %: 55.1 % (ref 43.0–77.0)
Platelets: 214 10*3/uL (ref 150.0–400.0)
RBC: 5.09 Mil/uL (ref 4.22–5.81)
RDW: 14 % (ref 11.5–15.5)
WBC: 8.1 10*3/uL (ref 4.0–10.5)

## 2014-05-28 LAB — POCT URINALYSIS DIPSTICK
Bilirubin, UA: NEGATIVE
Glucose, UA: NEGATIVE
Ketones, UA: NEGATIVE
LEUKOCYTES UA: NEGATIVE
Nitrite, UA: NEGATIVE
PROTEIN UA: NEGATIVE
RBC UA: NEGATIVE
SPEC GRAV UA: 1.015
Urobilinogen, UA: 0.2
pH, UA: 7

## 2014-05-28 LAB — HEPATIC FUNCTION PANEL
ALBUMIN: 4.4 g/dL (ref 3.5–5.2)
ALK PHOS: 44 U/L (ref 39–117)
ALT: 34 U/L (ref 0–53)
AST: 27 U/L (ref 0–37)
BILIRUBIN TOTAL: 1.4 mg/dL — AB (ref 0.2–1.2)
Bilirubin, Direct: 0.2 mg/dL (ref 0.0–0.3)
TOTAL PROTEIN: 7.3 g/dL (ref 6.0–8.3)

## 2014-05-28 LAB — LIPID PANEL
CHOLESTEROL: 222 mg/dL — AB (ref 0–200)
HDL: 52.5 mg/dL (ref >39.00)
LDL Cholesterol: 147 mg/dL — ABNORMAL HIGH (ref 0–99)
NonHDL: 169.5
Total CHOL/HDL Ratio: 4
Triglycerides: 111 mg/dL (ref 0.0–149.0)
VLDL: 22.2 mg/dL (ref 0.0–40.0)

## 2014-05-28 LAB — BASIC METABOLIC PANEL
BUN: 20 mg/dL (ref 6–23)
CALCIUM: 10 mg/dL (ref 8.4–10.5)
CHLORIDE: 100 meq/L (ref 96–112)
CO2: 30 mEq/L (ref 19–32)
Creatinine, Ser: 0.7 mg/dL (ref 0.4–1.5)
GFR: 115.71 mL/min (ref >60.00)
Glucose, Bld: 107 mg/dL — ABNORMAL HIGH (ref 70–99)
POTASSIUM: 5.3 meq/L — AB (ref 3.5–5.1)
SODIUM: 138 meq/L (ref 135–145)

## 2014-05-28 LAB — TSH: TSH: 2.88 u[IU]/mL (ref 0.35–4.50)

## 2014-05-28 LAB — PSA: PSA: 4.81 ng/mL — ABNORMAL HIGH (ref 0.10–4.00)

## 2014-05-28 MED ORDER — PREDNISONE 20 MG PO TABS: ORAL_TABLET | ORAL | Status: AC

## 2014-05-28 MED ORDER — ALLOPURINOL 300 MG PO TABS: 300.0000 mg | ORAL_TABLET | Freq: Every day | ORAL | Status: DC

## 2014-05-28 MED ORDER — ATORVASTATIN CALCIUM 20 MG PO TABS: 20.0000 mg | ORAL_TABLET | Freq: Every day | ORAL | Status: DC

## 2014-05-28 MED ORDER — ATENOLOL-CHLORTHALIDONE 100-25 MG PO TABS: ORAL_TABLET | ORAL | Status: DC

## 2014-05-28 NOTE — Progress Notes (Signed)
Subjective:    Patient ID: Adrian Reynolds, male    DOB: [birth year], 74 y.o.   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 He was on 10 mg of prednisone Monday Wednesday Friday for severe hand eczema. However his dermatologist took him off of it because of potential side effects. He's been off of the prednisone year. Now is having more trouble was hands and his feet. We discussed the pros and cons of long-term low-dose prednisone. He would like to try it again for a short period of time.  He gets routine eye care, dental care, colonoscopy 5 years ago by Dr. Maurene Capes normal  Vaccinations updated by Apolonio Schneiders  Cognitive function normal he walks daily home health safety reviewed no issues identified, no guns in the house, he does have a healthcare power of attorney and living well   Review of Systems  Constitutional: Negative.   HENT: Negative.   Eyes: Negative.   Respiratory: Negative.   Cardiovascular: Negative.   Gastrointestinal: Negative.   Endocrine: Negative.   Genitourinary: Negative.   Musculoskeletal: Negative.   Skin: Negative.   Allergic/Immunologic: Negative.   Neurological: Negative.   Hematological: Negative.   Psychiatric/Behavioral: Negative.        Objective:   Physical Exam  Constitutional: He is oriented to person, place, and time. He appears well-developed and well-nourished.  HENT:  Head: Normocephalic and atraumatic.  Right Ear: External ear normal.  Left Ear: External ear normal.  Nose: Nose normal.  Mouth/Throat: Oropharynx is clear and moist.  Eyes: Conjunctivae and EOM are normal. Pupils are equal, round, and reactive to light.  Neck: Normal range of motion. Neck supple. No JVD present. No tracheal deviation present. No thyromegaly present.  Cardiovascular: Normal rate, regular rhythm, normal heart  sounds and intact distal pulses.  Exam reveals no gallop and no friction rub.   No murmur heard. No carotid nor aortic bruits peripheral pulses 1+ and symmetrical  Pulmonary/Chest: Effort normal and breath sounds normal. No stridor. No respiratory distress. He has no wheezes. He has no rales. He exhibits no tenderness.  Abdominal: Soft. Bowel sounds are normal. He exhibits no distension and no mass. There is no tenderness. There is no rebound and no guarding.  Scar midline abdomen from previous AAA repair  He sees vascular every year for follow-up  Genitourinary: Rectum normal, prostate normal and penis normal. Guaiac negative stool. No penile tenderness.  Musculoskeletal: Normal range of motion. He exhibits no edema or tenderness.  Lymphadenopathy:    He has no cervical adenopathy.  Neurological: He is alert and oriented to person, place, and time. He has normal reflexes. No cranial nerve deficit. He exhibits normal muscle tone.  Skin: Skin is warm and dry. No rash noted. No erythema. No pallor.  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  Psychiatric: He has a normal mood and affect. His behavior is normal. Judgment and thought content normal.  Nursing note and vitals reviewed.         Assessment & Plan:  Healthy male  New lesion right back.......... advise remove ASAP  Hypertension at goal continue  current therapy  Hyperlipidemia continue current therapy check labs  History of gout continue allopurinol check labs  Eczema and some psoriasis restart short course of prednisone

## 2014-05-28 NOTE — Patient Instructions (Signed)
Continue current medications  Return after your trip to [place] to remove the lesion on your back  At the same time we'll do you skin tags  Prednisone 20 mg............ one tablet daily for 5 days or until you see significant improvement in your skin...Marland KitchenMarland KitchenMarland Kitchen then taper as outlined  Labs today

## 2014-05-28 NOTE — Progress Notes (Signed)
Pre visit review using our clinic review tool, if applicable. No additional management support is needed unless otherwise documented below in the visit note. 

## 2014-06-02 NOTE — Addendum Note (Signed)
Addended by: Westley Hummer B on: 06/02/2014 10:00 AM   Modules accepted: Orders

## 2014-06-03 ENCOUNTER — Other Ambulatory Visit: Payer: Self-pay | Admitting: Family Medicine

## 2014-06-03 DIAGNOSIS — R899 Unspecified abnormal finding in specimens from other organs, systems and tissues: Secondary | ICD-10-CM

## 2014-06-04 ENCOUNTER — Telehealth: Payer: Self-pay | Admitting: Family Medicine

## 2014-06-04 NOTE — Telephone Encounter (Signed)
emmi emailed °

## 2014-09-29 ENCOUNTER — Other Ambulatory Visit (INDEPENDENT_AMBULATORY_CARE_PROVIDER_SITE_OTHER): Payer: Medicare Other

## 2014-09-29 DIAGNOSIS — R899 Unspecified abnormal finding in specimens from other organs, systems and tissues: Secondary | ICD-10-CM | POA: Diagnosis not present

## 2014-09-29 LAB — PSA: PSA: 4.56 ng/mL — ABNORMAL HIGH (ref 0.10–4.00)

## 2014-10-01 ENCOUNTER — Other Ambulatory Visit: Payer: Self-pay | Admitting: Family Medicine

## 2014-10-01 DIAGNOSIS — R972 Elevated prostate specific antigen [PSA]: Secondary | ICD-10-CM

## 2014-10-03 DIAGNOSIS — H2513 Age-related nuclear cataract, bilateral: Secondary | ICD-10-CM | POA: Diagnosis not present

## 2014-10-27 DIAGNOSIS — H2513 Age-related nuclear cataract, bilateral: Secondary | ICD-10-CM | POA: Diagnosis not present

## 2014-10-27 DIAGNOSIS — H52222 Regular astigmatism, left eye: Secondary | ICD-10-CM | POA: Diagnosis not present

## 2014-10-27 DIAGNOSIS — H2512 Age-related nuclear cataract, left eye: Secondary | ICD-10-CM | POA: Diagnosis not present

## 2014-11-10 DIAGNOSIS — H2511 Age-related nuclear cataract, right eye: Secondary | ICD-10-CM | POA: Diagnosis not present

## 2014-11-10 DIAGNOSIS — H2513 Age-related nuclear cataract, bilateral: Secondary | ICD-10-CM | POA: Diagnosis not present

## 2014-11-12 ENCOUNTER — Encounter: Payer: Self-pay | Admitting: Family Medicine

## 2014-11-12 ENCOUNTER — Ambulatory Visit (INDEPENDENT_AMBULATORY_CARE_PROVIDER_SITE_OTHER): Payer: Medicare Other | Admitting: Family Medicine

## 2014-11-12 VITALS — BP 142/73 | HR 56 | Temp 98.0°F | Ht 70.75 in | Wt 244.0 lb

## 2014-11-12 DIAGNOSIS — R001 Bradycardia, unspecified: Secondary | ICD-10-CM | POA: Diagnosis not present

## 2014-11-12 DIAGNOSIS — I10 Essential (primary) hypertension: Secondary | ICD-10-CM

## 2014-11-12 MED ORDER — LISINOPRIL-HYDROCHLOROTHIAZIDE 20-25 MG PO TABS: 1.0000 | ORAL_TABLET | Freq: Every day | ORAL | Status: DC

## 2014-11-12 NOTE — Progress Notes (Signed)
Pre visit review using our clinic review tool, if applicable. No additional management support is needed unless otherwise documented below in the visit note. 

## 2014-11-12 NOTE — Progress Notes (Signed)
   Subjective:    Patient ID: Adrian Reynolds, male    DOB: May 27, 1940, 74 y.o.   MRN: [id number]  HPI Here to check his slow heart rate as requested by his ophthalmologist at the [facility]. He had cataract surgery yesterday and during the procedure his rate went down as low as 39, mostly averaging in the 40s. He feels fine and is very active. At his cpx here last January his EKG showed sinsus bradycardia in the 40s, and in fact his HR has been in the 40s or 50s here for years. He has been taking Tenoretic for years as well. His labs in January showed normal renal function, with no evidence of anemia or thyroid disease.    Review of Systems  Constitutional: Negative.   Respiratory: Negative.   Cardiovascular: Negative.   Endocrine: Negative.   Neurological: Negative.        Objective:   Physical Exam  Constitutional: He is oriented to person, place, and time. He appears well-developed and well-nourished. No distress.  Neck: No thyromegaly present.  Cardiovascular: Regular rhythm, normal heart sounds and intact distal pulses.  Exam reveals no gallop and no friction rub.   No murmur heard. Slow rate   Pulmonary/Chest: Effort normal and breath sounds normal.  Musculoskeletal: He exhibits no edema.  Lymphadenopathy:    He has no cervical adenopathy.  Neurological: He is alert and oriented to person, place, and time.          Assessment & Plan:  He has chronic bradycardia and no doubt this is influenced by the beta blocker he is taking. We will stop this and switch to Lisinopril HCT daily. Recheck in 3-4 weeks.

## 2015-02-09 DIAGNOSIS — Z961 Presence of intraocular lens: Secondary | ICD-10-CM | POA: Diagnosis not present

## 2015-02-09 DIAGNOSIS — H33312 Horseshoe tear of retina without detachment, left eye: Secondary | ICD-10-CM | POA: Diagnosis not present

## 2015-02-09 DIAGNOSIS — H33302 Unspecified retinal break, left eye: Secondary | ICD-10-CM | POA: Diagnosis not present

## 2015-02-09 DIAGNOSIS — H26491 Other secondary cataract, right eye: Secondary | ICD-10-CM | POA: Diagnosis not present

## 2015-02-09 DIAGNOSIS — H269 Unspecified cataract: Secondary | ICD-10-CM | POA: Diagnosis not present

## 2015-02-11 DIAGNOSIS — L918 Other hypertrophic disorders of the skin: Secondary | ICD-10-CM | POA: Diagnosis not present

## 2015-02-11 DIAGNOSIS — D1801 Hemangioma of skin and subcutaneous tissue: Secondary | ICD-10-CM | POA: Diagnosis not present

## 2015-02-11 DIAGNOSIS — L400 Psoriasis vulgaris: Secondary | ICD-10-CM | POA: Diagnosis not present

## 2015-02-11 DIAGNOSIS — L820 Inflamed seborrheic keratosis: Secondary | ICD-10-CM | POA: Diagnosis not present

## 2015-02-11 DIAGNOSIS — D485 Neoplasm of uncertain behavior of skin: Secondary | ICD-10-CM | POA: Diagnosis not present

## 2015-02-17 ENCOUNTER — Other Ambulatory Visit (INDEPENDENT_AMBULATORY_CARE_PROVIDER_SITE_OTHER): Payer: Medicare Other

## 2015-02-17 DIAGNOSIS — Z0000 Encounter for general adult medical examination without abnormal findings: Secondary | ICD-10-CM

## 2015-02-17 DIAGNOSIS — R972 Elevated prostate specific antigen [PSA]: Secondary | ICD-10-CM

## 2015-02-17 LAB — PSA: PSA: 5.25 ng/mL — AB (ref 0.10–4.00)

## 2015-02-18 ENCOUNTER — Other Ambulatory Visit: Payer: Self-pay | Admitting: Family Medicine

## 2015-02-24 ENCOUNTER — Inpatient Hospital Stay: Admission: RE | Admit: 2015-02-24 | Payer: Medicare Other | Source: Ambulatory Visit

## 2015-02-24 ENCOUNTER — Ambulatory Visit: Payer: Medicare Other | Admitting: Vascular Surgery

## 2015-03-03 ENCOUNTER — Other Ambulatory Visit (HOSPITAL_COMMUNITY): Payer: Medicare Other

## 2015-03-03 ENCOUNTER — Ambulatory Visit: Payer: Medicare Other | Admitting: Family

## 2015-03-19 ENCOUNTER — Encounter: Payer: Self-pay | Admitting: Vascular Surgery

## 2015-03-19 ENCOUNTER — Other Ambulatory Visit: Payer: Self-pay | Admitting: *Deleted

## 2015-03-19 ENCOUNTER — Other Ambulatory Visit: Payer: Self-pay | Admitting: Family Medicine

## 2015-03-19 ENCOUNTER — Other Ambulatory Visit (INDEPENDENT_AMBULATORY_CARE_PROVIDER_SITE_OTHER): Payer: Medicare Other

## 2015-03-19 DIAGNOSIS — I714 Abdominal aortic aneurysm, without rupture, unspecified: Secondary | ICD-10-CM

## 2015-03-19 DIAGNOSIS — I724 Aneurysm of artery of lower extremity: Secondary | ICD-10-CM

## 2015-03-19 DIAGNOSIS — I723 Aneurysm of iliac artery: Secondary | ICD-10-CM | POA: Diagnosis not present

## 2015-03-19 DIAGNOSIS — Z01812 Encounter for preprocedural laboratory examination: Secondary | ICD-10-CM

## 2015-03-19 LAB — BASIC METABOLIC PANEL
BUN: 24 mg/dL — AB (ref 6–23)
CALCIUM: 9.7 mg/dL (ref 8.4–10.5)
CHLORIDE: 100 meq/L (ref 96–112)
CO2: 29 mEq/L (ref 19–32)
CREATININE: 0.92 mg/dL (ref 0.40–1.50)
GFR: 85.61 mL/min (ref >60.00)
Glucose, Bld: 139 mg/dL — ABNORMAL HIGH (ref 70–99)
Potassium: 4.4 mEq/L (ref 3.5–5.1)
Sodium: 139 mEq/L (ref 135–145)

## 2015-03-20 ENCOUNTER — Ambulatory Visit
Admission: RE | Admit: 2015-03-20 | Discharge: 2015-03-20 | Disposition: A | Discharge status: Home / Self Care | Payer: Medicare Other | Source: Ambulatory Visit | Attending: Vascular Surgery | Admitting: Vascular Surgery

## 2015-03-20 DIAGNOSIS — I714 Abdominal aortic aneurysm, without rupture, unspecified: Secondary | ICD-10-CM

## 2015-03-20 DIAGNOSIS — Z48812 Encounter for surgical aftercare following surgery on the circulatory system: Secondary | ICD-10-CM

## 2015-03-20 DIAGNOSIS — Z95828 Presence of other vascular implants and grafts: Secondary | ICD-10-CM | POA: Diagnosis not present

## 2015-03-20 MED ORDER — IOPAMIDOL (ISOVUE-370) INJECTION 76%
75.0000 mL | Freq: Once | INTRAVENOUS | Status: AC | PRN
  Administered 2015-03-20: 75 mL via INTRAVENOUS

## 2015-03-24 ENCOUNTER — Ambulatory Visit (INDEPENDENT_AMBULATORY_CARE_PROVIDER_SITE_OTHER): Payer: Medicare Other | Admitting: Vascular Surgery

## 2015-03-24 ENCOUNTER — Encounter: Payer: Self-pay | Admitting: Vascular Surgery

## 2015-03-24 VITALS — BP 137/73 | HR 57 | Temp 97.2°F | Resp 16 | Ht 71.0 in | Wt 244.0 lb

## 2015-03-24 DIAGNOSIS — I714 Abdominal aortic aneurysm, without rupture, unspecified: Secondary | ICD-10-CM | POA: Insufficient documentation

## 2015-03-24 NOTE — Progress Notes (Signed)
Subjective:     Patient ID: Adrian Reynolds, male   DOB: [birth year], 74 y.o.   MRN: [id number]  HPI this 74 year old male returns for continued follow-up regarding his aortic stent graft for a large right common iliac artery aneurysm which I inserted into thousand and 9. Patient had remote history of aortic surgery for a ruptured abdominal aortic aneurysm performed at [facility]. He is now followed with slight enlargement of his suprarenal aorta but has had no evidence of endoleak's. He denies any abdominal or back symptoms since I last saw him. He does have some hip discomfort with ambulation which sounds mechanical.  Past Medical History  Diagnosis Date  . Hypertension   . Hyperlipidemia   . AAA (abdominal aortic aneurysm) ([place])   . Arthritis     left hip  . Iliac artery aneurysm [facility]) 2009    Social History  Substance Use Topics  . Smoking status: Former Smoker    Types: Cigarettes    Quit date: 02/14/1991  . Smokeless tobacco: Never Used  . Alcohol Use: 0.0 oz/week    0 Standard drinks or equivalent per week     Comment: occ    Family History  Problem Relation Age of Onset  . Diabetes Father   . Stroke Father   . Coronary artery disease Father     No Known Allergies   Current outpatient prescriptions:  .  allopurinol (ZYLOPRIM) 300 MG tablet, Take 1 tablet (300 mg total) by mouth daily., Disp: 100 tablet, Rfl: 3 .  aspirin 81 MG tablet, Take 81 mg by mouth daily.  , Disp: , Rfl:  .  atorvastatin (LIPITOR) 20 MG tablet, Take 1 tablet (20 mg total) by mouth at bedtime., Disp: 100 tablet, Rfl: 3 .  lisinopril-hydrochlorothiazide (PRINZIDE,ZESTORETIC) 20-25 MG tablet, TAKE ONE TABLET EACH DAY, Disp: 90 tablet, Rfl: 0 .  predniSONE (DELTASONE) 20 MG tablet, One tablet x 5 days or until your skin clears completely then, a half a tablet x 5 days, then half a tablet Monday, Wednesday, Friday, for two months (Patient not taking: Reported on 11/12/2014), Disp: 50 tablet, Rfl:  2  Filed Vitals:   03/24/15 0832  BP: 137/73  Pulse: 57  Temp: 97.2 F (36.2 C)  Resp: 16  Height: 5\' 11"  (1.803 m)  Weight: 244 lb (110.678 kg)  SpO2: 97%    Body mass index is 34.05 kg/(m^2).           Review of Systems denies chest pain, dyspnea on exertion, PND, orthopnea, hemoptysis. Has some mild left posterior hip discomfort with ambulation. Denies claudication. Has some small varicose veins are trace symptomatic. Other systems negative and complete review of systems     Objective:   Physical Exam BP 137/73 mmHg  Pulse 57  Temp(Src) 97.2 F (36.2 C)  Resp 16  Ht 5\' 11"  (1.803 m)  Wt 244 lb (110.678 kg)  BMI 34.05 kg/m2  SpO2 97%  Gen.-alert and oriented x3 in no apparent distress HEENT normal for age Lungs no rhonchi or wheezing Cardiovascular regular rhythm no murmurs carotid pulses 3+ palpable no bruits audible Abdomen soft nontender no palpable masses Musculoskeletal free of  major deformities Skin clear -no rashes Neurologic normal Lower extremities 3+ femoral and dorsalis pedis pulses palpable bilaterally with no edema A few left pretibial varicosities noted. No distal edema or hyperpigmentation or active ulceration.  Today I reviewed the CT angiogram which I had ordered. I reviewed the images by computer and the report  of the radiologist. The aortic stent graft is in excellent position with no evidence of endoleak. This the stent graft extends into the right external iliac and left common iliac arteries which are stable. There is slight increased size of the suprarenal aorta to 4.6 cm (previously 4.2 cm). He however has diffuse enlargement of the aorta with the diameter being 38 mm at the diaphragm. Both renal arteries are patent. There is narrowing of the celiac axis and the SMA is widely patent.       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 At May patient appointment to see Dr. Annamarie Major upon return since I'm retiring prior to that time. Patient understands this and is accepting of this.

## 2015-03-24 NOTE — Addendum Note (Signed)
Addended by: Dorthula Rue L on: 03/24/2015 11:30 AM   Modules accepted: Orders

## 2015-04-07 ENCOUNTER — Encounter: Payer: Self-pay | Admitting: Family Medicine

## 2015-04-07 ENCOUNTER — Ambulatory Visit (INDEPENDENT_AMBULATORY_CARE_PROVIDER_SITE_OTHER): Payer: Medicare Other | Admitting: Family Medicine

## 2015-04-07 VITALS — BP 124/84 | Temp 98.2°F | Wt 244.0 lb

## 2015-04-07 DIAGNOSIS — R739 Hyperglycemia, unspecified: Secondary | ICD-10-CM

## 2015-04-07 DIAGNOSIS — Z23 Encounter for immunization: Secondary | ICD-10-CM | POA: Diagnosis not present

## 2015-04-07 DIAGNOSIS — R7309 Other abnormal glucose: Secondary | ICD-10-CM | POA: Diagnosis not present

## 2015-04-07 LAB — BASIC METABOLIC PANEL
BUN: 26 mg/dL — AB (ref 6–23)
CO2: 29 mEq/L (ref 19–32)
Calcium: 9.8 mg/dL (ref 8.4–10.5)
Chloride: 102 mEq/L (ref 96–112)
Creatinine, Ser: 0.98 mg/dL (ref 0.40–1.50)
GFR: 79.58 mL/min (ref >60.00)
Glucose, Bld: 104 mg/dL — ABNORMAL HIGH (ref 70–99)
POTASSIUM: 4.9 meq/L (ref 3.5–5.1)
SODIUM: 138 meq/L (ref 135–145)

## 2015-04-07 LAB — HEMOGLOBIN A1C: HEMOGLOBIN A1C: 5.7 % (ref 4.6–6.5)

## 2015-04-07 NOTE — Progress Notes (Signed)
   Subjective:    Patient ID: Adrian Reynolds, male    DOB: November 17, 1940, 74 y.o.   MRN: [id number]  HPI Adrian Reynolds is a 74 year old married male nonsmoker who comes in today because of elevated blood sugar. His blood sugar in the past is always been in the 105 range. Weight is 244 pounds which classifies him with a BMI 34.1 is mildly obese class one  He came in for some routine labs prior to a CT scan of his abdomen. Ruptured aneurysm years ago. He's followed by Dr. Kellie Simmering yearly. Random metabolic panel showed a blood sugar 139. He's here today fasting to get follow-up labs  He takes allopurinol daily however he says sometimes he forgets and his gout flares up. He would like to keep some prednisone around just in case  He's also had bilateral cataracts and lens implants done at [facility] this past fall. That was uneventful  After first eye surgery he was noted to have marked bradycardia with heart rate and 40. His beta blocker was held. He came in and saw Dr. [name] who switched him to lisinopril 20-25. BP now 124/84. A slight cough but not bothersome to the patient Review of Systems    review of systems otherwise negative Objective:   Physical Exam  Well-developed well-nourished male no acute distress vital signs stable he is afebrile      Assessment & Plan:  Hypertension at goal.... Decrease lisinopril to 10 mg daily  Elevated blood sugar....... check labs.  Elevated PSA,,,,,,,,, follow-up with Dr. Kyung Rudd at the urology center

## 2015-04-07 NOTE — Progress Notes (Signed)
Pre visit review using our clinic review tool, if applicable. No additional management support is needed unless otherwise documented below in the visit note. 

## 2015-04-07 NOTE — Patient Instructions (Signed)
Labs today,,,,,,,,,,, we will call you the report  Prednisone as directed when you have a flare of the gout  Decrease your blood pressure medication..........Marland Kitchen Zestoretic 20-12 0.5........... take one half tablet daily in the morning

## 2015-04-08 ENCOUNTER — Encounter: Payer: Self-pay | Admitting: Internal Medicine

## 2015-06-01 ENCOUNTER — Encounter: Payer: Medicare Other | Admitting: Family Medicine

## 2015-06-03 DIAGNOSIS — H26492 Other secondary cataract, left eye: Secondary | ICD-10-CM | POA: Diagnosis not present

## 2015-06-03 DIAGNOSIS — H33312 Horseshoe tear of retina without detachment, left eye: Secondary | ICD-10-CM | POA: Diagnosis not present

## 2015-06-03 DIAGNOSIS — Z79899 Other long term (current) drug therapy: Secondary | ICD-10-CM | POA: Diagnosis not present

## 2015-06-03 DIAGNOSIS — H33302 Unspecified retinal break, left eye: Secondary | ICD-10-CM | POA: Diagnosis not present

## 2015-06-03 DIAGNOSIS — Z7982 Long term (current) use of aspirin: Secondary | ICD-10-CM | POA: Diagnosis not present

## 2015-06-03 DIAGNOSIS — Z961 Presence of intraocular lens: Secondary | ICD-10-CM | POA: Diagnosis not present

## 2015-06-09 ENCOUNTER — Other Ambulatory Visit: Payer: Self-pay | Admitting: Family Medicine

## 2015-06-22 DIAGNOSIS — N402 Nodular prostate without lower urinary tract symptoms: Secondary | ICD-10-CM | POA: Diagnosis not present

## 2015-06-22 DIAGNOSIS — R972 Elevated prostate specific antigen [PSA]: Secondary | ICD-10-CM | POA: Diagnosis not present

## 2015-07-27 ENCOUNTER — Encounter: Payer: Medicare Other | Admitting: Family Medicine

## 2015-08-24 ENCOUNTER — Ambulatory Visit (INDEPENDENT_AMBULATORY_CARE_PROVIDER_SITE_OTHER): Payer: Medicare Other | Admitting: Family Medicine

## 2015-08-24 ENCOUNTER — Telehealth: Payer: Self-pay | Admitting: Family Medicine

## 2015-08-24 ENCOUNTER — Telehealth: Payer: Self-pay | Admitting: *Deleted

## 2015-08-24 ENCOUNTER — Encounter: Payer: Self-pay | Admitting: Family Medicine

## 2015-08-24 VITALS — BP 130/80 | Temp 98.1°F | Ht 71.0 in | Wt 242.0 lb

## 2015-08-24 DIAGNOSIS — R739 Hyperglycemia, unspecified: Secondary | ICD-10-CM

## 2015-08-24 DIAGNOSIS — I10 Essential (primary) hypertension: Secondary | ICD-10-CM

## 2015-08-24 DIAGNOSIS — Z9889 Other specified postprocedural states: Secondary | ICD-10-CM

## 2015-08-24 DIAGNOSIS — M1A0790 Idiopathic chronic gout, unspecified ankle and foot, without tophus (tophi): Secondary | ICD-10-CM | POA: Diagnosis not present

## 2015-08-24 DIAGNOSIS — Z0000 Encounter for general adult medical examination without abnormal findings: Secondary | ICD-10-CM | POA: Insufficient documentation

## 2015-08-24 DIAGNOSIS — E785 Hyperlipidemia, unspecified: Secondary | ICD-10-CM | POA: Diagnosis not present

## 2015-08-24 LAB — HEPATIC FUNCTION PANEL
ALBUMIN: 4.5 g/dL (ref 3.5–5.2)
ALT: 27 U/L (ref 0–53)
AST: 22 U/L (ref 0–37)
Alkaline Phosphatase: 50 U/L (ref 39–117)
BILIRUBIN TOTAL: 1.2 mg/dL (ref 0.2–1.2)
Bilirubin, Direct: 0.2 mg/dL (ref 0.0–0.3)
Total Protein: 7.4 g/dL (ref 6.0–8.3)

## 2015-08-24 LAB — CBC WITH DIFFERENTIAL/PLATELET
Basophils Absolute: 0 10*3/uL (ref 0.0–0.1)
Basophils Relative: 0.3 % (ref 0.0–3.0)
EOS PCT: 6.4 % — AB (ref 0.0–5.0)
Eosinophils Absolute: 0.5 10*3/uL (ref 0.0–0.7)
HEMATOCRIT: 46.5 % (ref 39.0–52.0)
HEMOGLOBIN: 15.8 g/dL (ref 13.0–17.0)
Lymphocytes Relative: 27 % (ref 12.0–46.0)
Lymphs Abs: 2.3 10*3/uL (ref 0.7–4.0)
MCHC: 34.1 g/dL (ref 30.0–36.0)
MCV: 95.8 fl (ref 78.0–100.0)
MONO ABS: 0.6 10*3/uL (ref 0.1–1.0)
MONOS PCT: 6.6 % (ref 3.0–12.0)
Neutro Abs: 5 10*3/uL (ref 1.4–7.7)
Neutrophils Relative %: 59.7 % (ref 43.0–77.0)
Platelets: 211 10*3/uL (ref 150.0–400.0)
RBC: 4.85 Mil/uL (ref 4.22–5.81)
RDW: 13.5 % (ref 11.5–15.5)
WBC: 8.4 10*3/uL (ref 4.0–10.5)

## 2015-08-24 LAB — LIPID PANEL
CHOL/HDL RATIO: 4
Cholesterol: 179 mg/dL (ref 0–200)
HDL: 44.6 mg/dL (ref >39.00)
LDL CALC: 110 mg/dL — AB (ref 0–99)
NonHDL: 134.54
TRIGLYCERIDES: 125 mg/dL (ref 0.0–149.0)
VLDL: 25 mg/dL (ref 0.0–40.0)

## 2015-08-24 LAB — TSH: TSH: 2.01 u[IU]/mL (ref 0.35–4.50)

## 2015-08-24 MED ORDER — LISINOPRIL-HYDROCHLOROTHIAZIDE 20-25 MG PO TABS: ORAL_TABLET | ORAL | Status: AC

## 2015-08-24 MED ORDER — ALLOPURINOL 300 MG PO TABS: 300.0000 mg | ORAL_TABLET | Freq: Every day | ORAL | Status: AC

## 2015-08-24 MED ORDER — ATORVASTATIN CALCIUM 20 MG PO TABS: 20.0000 mg | ORAL_TABLET | Freq: Every day | ORAL | Status: AC

## 2015-08-24 MED ORDER — SILDENAFIL CITRATE 20 MG PO TABS: ORAL_TABLET | ORAL | Status: DC

## 2015-08-24 NOTE — Telephone Encounter (Signed)
Left message on machine for patient to return our call.  Patient should use ear wax drops in his right ear for 10 days.  Then he should schedule an appointment to have his ear flushed at no cost.

## 2015-08-24 NOTE — Patient Instructions (Signed)
Continue current medications  Restart your diet and exercise program,,,,,,,,,,,, carbohydrate free  Generic Viagra 20 mg................ one tablet 2-3 hours prior to sex  Labs today.......Marland Kitchen we will call you if there is anything abnormal  Asked the lab folks to send a copy of all your lab work to Dr. Shirlyn Goltz in urology

## 2015-08-24 NOTE — Progress Notes (Signed)
Subjective:    Patient ID: EH WAVER, male    DOB: 1941-04-27, 75 y.o.   MRN: [id number]  HPI Adrian Reynolds is a 75 year old married male nonsmoker who comes in today for general physical examination because of a history of gout, hypertension, hyperlipidemia, and a history of a ruptured AAA.  He takes allopurinol 3 mg daily. He has no gouty attacks on medication  He takes 20 mg of Lipitor and an aspirin tablet daily because of a history of hyperlipidemia  He takes a study 20-25 daily for hypertension BP 130/80. He was on a beta blocker. However he had an episode of bradycardia last year.Adrian Reynolds took him off the beta blocker started Zestoretic and he's done well with no complications except for mild cough which he does not think significant enough to change his medication  He gets routine eye care, dental care, colonoscopy 2009 normal  Vaccinations up-to-date  He sees vascular yearly and has a ultrasound to follow-up on the repair of his AAA.  He sees Dr. Shirlyn Goltz because of elevated PSA. Exams been normal. He scheduled for biopsy next month  His weight is up 6 pounds from last year. BMI is 34.05. Recommend as we have in the past he started diet and exercise program.  Cognitive function normal he does not exercise on a regular basis, home health safety reviewed no issues identified, no guns in the house, he does have a healthcare power of attorney and living well     Review of Systems  Constitutional: Negative.   HENT: Negative.   Eyes: Negative.   Respiratory: Negative.   Cardiovascular: Negative.   Gastrointestinal: Negative.   Endocrine: Negative.   Genitourinary: Negative.   Musculoskeletal: Negative.   Skin: Negative.   Allergic/Immunologic: Negative.   Neurological: Negative.   Hematological: Negative.   Psychiatric/Behavioral: Negative.        Objective:   Physical Exam  Constitutional: He is oriented to person, place, and time. He appears well-developed and  well-nourished.  HENT:  Head: Normocephalic and atraumatic.  Right Ear: External ear normal.  Left Ear: External ear normal.  Nose: Nose normal.  Mouth/Throat: Oropharynx is clear and moist.  Eyes: Conjunctivae and EOM are normal. Pupils are equal, round, and reactive to light.  Neck: Normal range of motion. Neck supple. No JVD present. No tracheal deviation present. No thyromegaly present.  Cardiovascular: Normal rate, regular rhythm, normal heart sounds and intact distal pulses.  Exam reveals no gallop and no friction rub.   No murmur heard. No carotid neurologic bruits peripheral pulses 2+ and symmetrical  Pulmonary/Chest: Effort normal and breath sounds normal. No stridor. No respiratory distress. He has no wheezes. He has no rales. He exhibits no tenderness.  Abdominal: Soft. Bowel sounds are normal. He exhibits no distension and no mass. There is no tenderness. There is no rebound and no guarding.  Genitourinary:  Genitorectal exam done by urologist therefore not repeated  Musculoskeletal: Normal range of motion. He exhibits no edema or tenderness.  Lymphadenopathy:    He has no cervical adenopathy.  Neurological: He is alert and oriented to person, place, and time. He has normal reflexes. No cranial nerve deficit. He exhibits normal muscle tone.  Skin: Skin is warm and dry. No rash noted. No erythema. No pallor.  Total body skin exam normal scar mid abdomen from previous AAA repair no bruits  Psychiatric: He has a normal mood and affect. His behavior is normal. Judgment and thought content normal.  Nursing note  and vitals reviewed.         Assessment & Plan:  Hypertension at goal....... continue current therapy  History gout....... continue allopurinol  Hyperlipidemia......... continue Lipitor and aspirin  Status post AAA repair... Routine follow-up by vascular  Elevated PSA............ biopsies pending by Dr. Shirlyn Goltz

## 2015-08-24 NOTE — Progress Notes (Signed)
Pre visit review using our clinic review tool, if applicable. No additional management support is needed unless otherwise documented below in the visit note. 

## 2015-08-24 NOTE — Telephone Encounter (Signed)
Refill sent.

## 2015-08-24 NOTE — Telephone Encounter (Signed)
[facility] needs direction for sidenafil 20 mg

## 2015-08-25 ENCOUNTER — Other Ambulatory Visit: Payer: Self-pay | Admitting: *Deleted

## 2015-08-25 MED ORDER — NEOMYCIN-POLYMYXIN-HC 1 % OT SOLN: 2.0000 [drp] | Freq: Three times a day (TID) | OTIC | Status: AC

## 2015-08-25 NOTE — Telephone Encounter (Signed)
Patient came to the office today for ear wax removal

## 2015-08-25 NOTE — Telephone Encounter (Signed)
rx sent per Dr Todd 

## 2015-09-07 DIAGNOSIS — R972 Elevated prostate specific antigen [PSA]: Secondary | ICD-10-CM | POA: Diagnosis not present

## 2015-09-15 DIAGNOSIS — N402 Nodular prostate without lower urinary tract symptoms: Secondary | ICD-10-CM | POA: Diagnosis not present

## 2015-09-15 DIAGNOSIS — D075 Carcinoma in situ of prostate: Secondary | ICD-10-CM | POA: Diagnosis not present

## 2015-09-15 DIAGNOSIS — Z0000 Encounter for general adult medical examination without abnormal findings: Secondary | ICD-10-CM | POA: Diagnosis not present

## 2015-09-15 DIAGNOSIS — R972 Elevated prostate specific antigen [PSA]: Secondary | ICD-10-CM | POA: Diagnosis not present

## 2015-09-21 DIAGNOSIS — Z0000 Encounter for general adult medical examination without abnormal findings: Secondary | ICD-10-CM | POA: Diagnosis not present

## 2015-09-21 DIAGNOSIS — C61 Malignant neoplasm of prostate: Secondary | ICD-10-CM | POA: Diagnosis not present

## 2015-09-21 DIAGNOSIS — N402 Nodular prostate without lower urinary tract symptoms: Secondary | ICD-10-CM | POA: Diagnosis not present

## 2015-09-21 DIAGNOSIS — R972 Elevated prostate specific antigen [PSA]: Secondary | ICD-10-CM | POA: Diagnosis not present

## 2015-09-28 ENCOUNTER — Encounter: Payer: Self-pay | Admitting: Medical Oncology

## 2015-09-30 ENCOUNTER — Encounter: Payer: Self-pay | Admitting: Radiation Oncology

## 2015-09-30 NOTE — Progress Notes (Signed)
GU Location of Tumor / Histology: prostatic adenocarcinoma  If Prostate Cancer, Gleason Score is (4+3) and PSA is (5.69)  Adrian Reynolds was referred by Dr. Christie Nottingham to Dr. Jeffie Pollock to evaluate his rising PSA. Prostate ca identified thus, Dr. Jeffie Pollock referred the patient to Dr. Alinda Money to discuss surgical options. Dr. Alinda Money referred the patient to Dr. Tammi Klippel   Biopsies of prostate (if applicable) revealed:    Past/Anticipated interventions by urology, if any: biopsy and referral to Dr. Tammi Klippel  Past/Anticipated interventions by medical oncology, if any: no  Weight changes, if any: no  Bowel/Bladder complaints, if any: Denies voiding difficulties but, IPSS at [facility] was 4 ???   Nausea/Vomiting, if any: no  Pain issues, if any:  no  SAFETY ISSUES:  Prior radiation? no  Pacemaker/ICD? no  Possible current pregnancy? no  Is the patient on methotrexate? no  Current Complaints / other details:  74 year old male. Former smoker. NKDA. Married with three children. Works at [employer].  Prostate volume 103.3 cc

## 2015-09-30 NOTE — Progress Notes (Signed)
Oncology Nurse Navigator Documentation  Oncology Nurse Navigator Flowsheets 09/28/2015  Navigator Location [facility]  Navigator Encounter Type Introductory phone call  Abnormal Finding Date 07/21/2015  Confirmed Diagnosis Date 09/01/2015  Barriers/Navigation Needs No barriers at this time  Interventions None required  Support Groups/Services Friends and Family  Acuity Level 1  Acuity Level 1 Initial guidance, education and coordination as needed  Time Spent with Patient 15   I called Adrian Reynolds to introduce myself as the Prostate Nurse Navigator and the Coordinator of the Prostate [place].  1. I confirmed with the patient he is aware of his referral to the clinic for Friday May 12 th arriving at 7:30am.  2. I discussed the format of the clinic and the physicians he will be seeing that day.  3. I discussed where the clinic is located and how to contact me.  4. I confirmed his address and informed him I would be mailing a packet of information and forms to be completed. I asked him to bring them with him the day of his appointment.   He voiced understanding of the above. I asked him to call me if he has any questions or concerns regarding his appointments or the forms he needs to complete.

## 2015-10-01 ENCOUNTER — Encounter: Payer: Self-pay | Admitting: Medical Oncology

## 2015-10-02 ENCOUNTER — Ambulatory Visit
Admission: RE | Admit: 2015-10-02 | Discharge: 2015-10-02 | Disposition: A | Discharge status: Home / Self Care | Payer: Medicare Other | Source: Ambulatory Visit | Attending: Radiation Oncology | Admitting: Radiation Oncology

## 2015-10-02 ENCOUNTER — Ambulatory Visit (HOSPITAL_BASED_OUTPATIENT_CLINIC_OR_DEPARTMENT_OTHER): Payer: Medicare Other | Admitting: Oncology

## 2015-10-02 ENCOUNTER — Encounter: Payer: Self-pay | Admitting: Medical Oncology

## 2015-10-02 ENCOUNTER — Encounter: Payer: Self-pay | Admitting: Radiation Oncology

## 2015-10-02 ENCOUNTER — Encounter: Payer: Self-pay | Admitting: General Practice

## 2015-10-02 VITALS — BP 140/80 | HR 64 | Resp 16 | Ht <= 58 in | Wt 247.2 lb

## 2015-10-02 DIAGNOSIS — E7800 Pure hypercholesterolemia, unspecified: Secondary | ICD-10-CM | POA: Insufficient documentation

## 2015-10-02 DIAGNOSIS — C61 Malignant neoplasm of prostate: Secondary | ICD-10-CM | POA: Insufficient documentation

## 2015-10-02 DIAGNOSIS — Z8679 Personal history of other diseases of the circulatory system: Secondary | ICD-10-CM | POA: Insufficient documentation

## 2015-10-02 DIAGNOSIS — Z8546 Personal history of malignant neoplasm of prostate: Secondary | ICD-10-CM | POA: Diagnosis not present

## 2015-10-02 DIAGNOSIS — M109 Gout, unspecified: Secondary | ICD-10-CM | POA: Diagnosis not present

## 2015-10-02 DIAGNOSIS — Z87891 Personal history of nicotine dependence: Secondary | ICD-10-CM | POA: Insufficient documentation

## 2015-10-02 DIAGNOSIS — I10 Essential (primary) hypertension: Secondary | ICD-10-CM | POA: Insufficient documentation

## 2015-10-02 DIAGNOSIS — Z79899 Other long term (current) drug therapy: Secondary | ICD-10-CM | POA: Diagnosis not present

## 2015-10-02 DIAGNOSIS — Z7982 Long term (current) use of aspirin: Secondary | ICD-10-CM | POA: Insufficient documentation

## 2015-10-02 DIAGNOSIS — M1990 Unspecified osteoarthritis, unspecified site: Secondary | ICD-10-CM | POA: Insufficient documentation

## 2015-10-02 DIAGNOSIS — I714 Abdominal aortic aneurysm, without rupture: Secondary | ICD-10-CM | POA: Insufficient documentation

## 2015-10-02 DIAGNOSIS — Z9889 Other specified postprocedural states: Secondary | ICD-10-CM | POA: Insufficient documentation

## 2015-10-02 DIAGNOSIS — E785 Hyperlipidemia, unspecified: Secondary | ICD-10-CM | POA: Insufficient documentation

## 2015-10-02 HISTORY — DX: Malignant neoplasm of prostate: C61

## 2015-10-02 NOTE — Progress Notes (Signed)
[place] Psychosocial Distress Screening Spiritual Care  Spiritual Care was referred by distress screening protocol.  The patient scored a 1 on the Psychosocial Distress Thermometer which indicates mild distress. Plan to f/u by phone to assess for distress and other psychosocial needs.   ONCBCN DISTRESS SCREENING 10/02/2015  Screening Type Initial Screening  Distress experienced in past week (1-10) 1    Follow up needed: Yes.  Was unable to see pt in Prostate Multidisciplinary Clinic.  Plan to f/u by phone to introduce [facility] team/resources and assess for distress.  Please also page as needs arise/circumstances change.  Thank you.  [place], [place], [facility] Pager [phone] Voicemail [phone]

## 2015-10-02 NOTE — Progress Notes (Signed)
Radiation Oncology         [phone] ________________________________  Initial outpatient Consultation  Name: Adrian Reynolds MRN: [id number]  Date: 10/02/2015  DOB: [birth year]  XN:ATFT,DDUKGUR ALLEN, MD  Adrian Bring, MD   REFERRING PHYSICIAN: Raynelle Bring, MD  DIAGNOSIS: 75 y.o. 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    ICD-9-CM ICD-10-CM   1. Malignant neoplasm of prostate ([place]) [name] is a 75 y.o. gentleman.  He was noted to have an elevated PSA of 5.69 by his primary care physician, Dr. Sherren Mocha.  Accordingly, he was referred for evaluation in urology by Dr. Jeffie Pollock on 06/22/15, digital rectal examination was performed at that time noting firmness at the left and right apex. The patient proceeded to transrectal ultrasound with 12 biopsies of the prostate on 09/15/15. The prostate volume measured 103.3 cc. Out of 12 core biopsies,7 were positive. The maximum Gleason score was 4+3=7.  The patient reviewed the biopsy results with his urologist and he has kindly been referred today for discussion of potential radiation treatment options.     PREVIOUS RADIATION THERAPY: No  PAST MEDICAL HISTORY:  Past Medical History  Diagnosis Date  . Hypertension   . Hyperlipidemia   . AAA (abdominal aortic aneurysm) (Adrian Reynolds)   . Arthritis     left hip  . 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 Prostate cancer [facility])     PAST SURGICAL HISTORY: Past Surgical History  Procedure Laterality Date  . Abdominal aortic aneurysm repair    . Hernia repair      Mesh ventral  . Prostate biopsy      FAMILY HISTORY:  Family History  Problem Relation Age of Onset  . Diabetes Father   . Stroke Father   . Coronary artery disease Father   . Cancer Neg Hx     SOCIAL HISTORY:  reports that he quit smoking about 24 years ago. His smoking use included Cigarettes. He has never used smokeless  tobacco. He reports that he drinks alcohol. He reports that he does not use illicit drugs. The patient is married and resides in [place]. 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  ALLERGIES: Review of patient's allergies indicates no known allergies.  MEDICATIONS:  Current Outpatient Prescriptions  Medication Sig Dispense Refill  . allopurinol (ZYLOPRIM) 300 MG tablet Take 1 tablet (300 mg total) by mouth daily. 100 tablet 3  . aspirin 81 MG tablet Take 81 mg by mouth daily.      Marland Kitchen atorvastatin (LIPITOR) 20 MG tablet Take 1 tablet (20 mg total) by mouth at bedtime. 100 tablet 3  . lisinopril-hydrochlorothiazide (PRINZIDE,ZESTORETIC) 20-25 MG tablet TAKE ONE TABLET EACH DAY 90 tablet 3  . NEOMYCIN-POLYMYXIN-HYDROCORTISONE (CORTISPORIN) 1 % SOLN otic solution Place 2 drops into the right ear every 8 (eight) hours. 10 mL 0  . predniSONE (DELTASONE) 20 MG tablet One tablet x 5 days or until your skin clears completely then, a half a tablet x 5 days, then half a tablet Monday, Wednesday, Friday, for two months 50 tablet 2  . sildenafil (REVATIO) 20 MG tablet Take one tab at bedtime as needed 20 tablet 10   No current facility-administered medications for this encounter.    REVIEW OF SYSTEMS: On review of systems, the patient reports that he is doing well overall. He denies any chest pain, shortness of breath, cough, fevers, chills,  night sweats, unintended weight changes. He denies any bowel or bladder disturbances, and denies abdominal pain, nausea or vomiting. He denies any new musculoskeletal or joint aches or pains. The patient completed an IPSS and IIEF questionnaire.  His IPSS score was 0 indicating no urinary outflow obstructive symptoms.  He indicated that his erectile function is adequate to complete sexual activity with every attempt. A complete review of systems is obtained and is otherwise negative.   PHYSICAL EXAM:   height is 6" (0.152 m) and weight is 247 lb 3.2 oz (112.129 kg). His blood  pressure is 140/80 and his pulse is 64. His respiration is 16 and oxygen saturation is 100%.   In general this is a well appearing Caucasian male in no acute distress. He is alert and oriented x4 and appropriate throughout the examination. HEENT reveals that the patient is normocephalic, atraumatic. EOMs are intact. PERRLA. Skin is intact without any evidence of gross lesions. Cardiovascular exam reveals a regular rate and rhythm, no clicks rubs or murmurs are auscultated. Chest is clear to auscultation bilaterally. Lymphatic assessment is performed and does not reveal any adenopathy in the cervical, supraclavicular, axillary, or inguinal chains. Abdomen has active bowel sounds in all quadrants and is intact. The abdomen is soft, non tender, non distended. Lower extremities are negative for pretibial pitting edema, deep calf tenderness, cyanosis or clubbing.   KPS = 100  100 - Normal; no complaints; no evidence of disease. 90   - Able to carry on normal activity; minor signs or symptoms of disease. 80   - Normal activity with effort; some signs or symptoms of disease. 45   - Cares for self; unable to carry on normal activity or to do active work. 60   - Requires occasional assistance, but is able to care for most of his personal needs. 50   - Requires considerable assistance and frequent medical care. 107   - Disabled; requires special care and assistance. 38   - Severely disabled; hospital admission is indicated although death not imminent. 71   - Very sick; hospital admission necessary; active supportive treatment necessary. 10   - Moribund; fatal processes progressing rapidly. 0     - Dead  Karnofsky DA, Abelmann [place], Craver LS and Burchenal [facility] [phone]) The use of the nitrogen mustards in the palliative treatment of carcinoma: with particular reference to bronchogenic carcinoma Cancer 1 634-56   LABORATORY DATA:  Lab Results  Component Value Date   WBC 8.4 08/24/2015   HGB 15.8 08/24/2015    HCT 46.5 08/24/2015   MCV 95.8 08/24/2015   PLT 211.0 08/24/2015   Lab Results  Component Value Date   NA 138 04/07/2015   K 4.9 04/07/2015   CL 102 04/07/2015   CO2 29 04/07/2015   Lab Results  Component Value Date   ALT 27 08/24/2015   AST 22 08/24/2015   ALKPHOS 50 08/24/2015   BILITOT 1.2 08/24/2015     RADIOGRAPHY: No results found.    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   PLAN: The patient has met with each of the physicians in the [facility] clinic. He is not a candidate for robotic surgery at this time due to his previous surgical procedures, and after discussing with the patient that his only surgical option is for an open prostatectomy and with this also accompanies increased  risk of erectile dysfunction and complications of long term incontinence which is also higher risk due to his age.  He also would not be a candidate for radioactive seed implant due to his prostatic volume. That being said he is a candidate for external radiotherapy. We discussed that for patients who have high volume Gleason 4+3 disease, radiotherapy with the addition of 6 months of ADT can increase his disease free interval. It would not change overall survival, but is a consideration in his case. The patient is interested in being as aggressive as he can be to ensure he remains cancer free. He will consider his options after speaking with Dr. Alinda Money, but is leaning towards external radiotherapy with 6 months of ADT. We have discussed the logistics and delivery of radiotherapy as well as outlined the planning process, fiducial marker placement, and the timing of beginning radiotherapy. He understands the risks, benefits, and side effects of treatment.  I will contact him next week to see if he has decided which pathway he is interested in.   The above documentation reflects my direct findings  during this shared patient visit. Please see the separate note by Dr. Tammi Klippel on this date for the remainder of the patient's plan of care.    Carola Rhine, PAC

## 2015-10-02 NOTE — Progress Notes (Signed)
Oncology Nurse Navigator Documentation  Oncology Nurse Navigator Flowsheets 09/28/2015 10/01/2015 10/02/2015  Navigator Location [facility] - -  Navigator Encounter Type Introductory phone call Telephone Clinic/MDC  Telephone - Appt Confirmation/Clarification- Pt confirmed appointment for Prostate [facility] 5/12 arriving at 7:45am. I asked him to bring his complete paper work. -  Abnormal Finding Date 07/21/2015 - -  Confirmed Diagnosis Date 09/01/2015 - -  Barriers/Navigation Needs No barriers at this time - Education  Education - - Occupational psychologist Treatment Options  Interventions None required - Education Method  Education Method - - Teach-back;Verbal;Written  Support Groups/Services Friends and Family - Prostate Support Group;Friends and Family  Acuity Level 1 - Level 1  Acuity Level 1 Initial guidance, education and coordination as needed - Initial guidance, education and coordination as needed  Time Spent with Patient 15 15 > 120

## 2015-10-02 NOTE — Consult Note (Signed)
Chief Complaint  Prostate Cancer   Reason For Visit  Reason for consult: To discuss treatment options for prostate cancer. Physician requesting consult: Dr. Irine Seal PCP: Dr. Christie Nottingham Location of consult: [facility]   History of Present Illness  Mr. Dicke is a 75 year old gentleman with a past medical history significant for gout, hyperlipidemia, hypertension, and vascular disease s/p repair of a ruptured AAA at [facility] in 1996 and endovascular repair of an iliac aneurysm in 2009 by Dr. Kellie Simmering in [place]. He also has undergone multiple hernia repairs of his abdominal incision including with mesh.  He was noted to have an elevated PSA of 5.69 and bilateral apical nodularity of the prostate prompting a TRUS biopsy of the prostate on 09/15/15.  This demonstrated Gleason 4+3=7 adenocarcinoma of the prostate in 7 out of 12 biopsy cores.  He has no family history of prostate cancer.  TNM stage: cT2c Nx Mx PSA: 5.69 Gleason score: 4+3=7 Biopsy (09/15/15): 7/12 cores positive    Left: L lateral apex (5%, 3+3=6), L lateral apex (50%, 4+3=7), L lateral mid (20%, 3+4=7), L mid (50%, 4+3=7), L lateral base (50%, 4+3=7), L base (70%, 4+3=7)    Right: R apex (5%, 3+3=6) Prostate volume: 103.3 cc  Nomogram OC disease: 21% EPE: 75% SVI: 21% LNI: 20% PFS (surgery): 53% at 5 years, 38% at 10 years  Urinary function: IPSS is 4. He has minimal LUTS. Erectile function: SHIM score is 23.   Past Medical History  1. 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 History of gout (Z87.39)  3. History of hypercholesterolemia (Z86.39)  4. History of hypertension (Z86.79)  Surgical History  1. History of Endovascular Repair Of Iliac Artery Aneurysm  2. History of Surgery For Abdominal Aortic Aneurysm  Current Meds  1. Allopurinol 300 MG Oral Tablet;  Therapy: (Recorded:30Jan2017) to Recorded  2. Aspirin 81 MG TABS;   Therapy: (Recorded:30Jan2017) to Recorded  3. Atenolol-Chlorthalidone 100-25 MG Oral Tablet;  Therapy: (Recorded:30Jan2017) to Recorded  4. Atorvastatin Calcium 20 MG Oral Tablet;  Therapy: (Recorded:30Jan2017) to Recorded  5. LevoFLOXacin 500 MG Oral Tablet; Take one tablet daily starting day before procedure;  Therapy: [id number] to (Evaluate:02Feb2017)  Requested for: [id number]; Last  Rx:30Jan2017 Ordered  Allergies  1. No Known Drug Allergies  Family History  1. Family history of hepatic failure (Z83.79) : Mother  2. Family history of kidney cancer (Z80.51) : Mother  3. Family history of malignant neoplasm of brain (Z80.8) : Father  4. Family history of renal failure (Z84.1)  Social History   Consumes alcohol weekly (Z78.9)   Former smoker (Z87.891)   Married   No caffeine use   Occupation   Three children  Review of Systems AU Complete-Male: Genitourinary, constitutional, skin, eye, otolaryngeal, hematologic/lymphatic, cardiovascular, pulmonary, endocrine, musculoskeletal, gastrointestinal, neurological and psychiatric system(s) were reviewed and pertinent findings if present are noted and are otherwise negative.    Physical Exam Constitutional: Well nourished and well developed . No acute distress.    Results/Data  I have reviewed his PSA results, medical records and have independently reviewed his pathology slides and his CT scan from October 2016 in the [facility] conference this morning.     Assessment  1. Prostate cancer ([facility])  Discussion/Summary  1. Prostate cancer: I had a detailed conversation with Mr. Adrian Reynolds and his wife today.   The patient was counseled about the natural history of prostate cancer and the standard treatment  options that are available for prostate cancer. It was explained to him how his age and life expectancy, clinical stage, Gleason score, and PSA affect his prognosis, the decision to proceed with additional staging studies, as well as how  that information influences recommended treatment strategies. We discussed the roles for active surveillance, radiation therapy, surgical therapy, androgen deprivation, as well as ablative therapy options for the treatment of prostate cancer as appropriate to his individual cancer situation. We discussed the risks and benefits of these options with regard to their impact on cancer control and also in terms of potential adverse events, complications, and impact on quiality of life particularly related to urinary, bowel, and sexual function. The patient was encouraged to ask questions throughout the discussion today and all questions were answered to his stated satisfaction. In addition, the patient was provided with and/or directed to appropriate resources and literature for further education about prostate cancer and treatment options.   We did discuss that his nomogram risk of lymph node involvement is 20% and a repeat CT scan of the pelvis (his last CT was 7 months ago) may be helpful to further assess his true risk of metastatic disease. We discussed that he would not be a candidate for minimally invasive robotic surgery considering his prior surgical history.  We focused our discussion on the options of open radical prostatectomy and radiation therapy.  After reviewing the pros and cons of each option, he is inclined to proceed with radiation therapy and 6 months of ADT.  We reviewed the logistics of scheduling this treatment and the potential risks and side effects of these therapies.  I will plan to notify Dr. Jeffie Pollock of Mr. Zapata decision so he can be scheduled for a CT scan of the pelvis to complete his staging and to proceed with starting ADT and get fiducial markers placed.  CC: Dr. Irine Seal Dr. Christie Nottingham Dr. Tyler Pita Dr. Zola Button  A total of 45 minutes were spent in the overall care of the patient today with 45 minutes in direct face to face consultation.     Signatures Electronically signed by : Raynelle Bring, M.D.; Oct 02 2015 12:09PM EST

## 2015-10-02 NOTE — Progress Notes (Signed)
                               Care Plan Summary  Name: Adrian Reynolds DOB: [birth year]   Your Medical Team:   Urologist -  Dr. Raynelle Bring, [facility]  Radiation Oncologist - Dr.Matthew Micki [facility]   Medical Oncologist - Dr. Zola Button, [place]  Recommendations: 1) Staging CT of abdomen and pelvis 2) Androgen Deprivation (hormone injections) 3) Radiation therapy  * These recommendations are based on information available as of today's consult.      Recommendations may change depending on the results of further tests or exams.  Next Steps: 1) Dr. Ralene Muskrat office will schedule the CT   2) Dr. Ralene Muskrat office will schedule the hormone injections and placement of gold markers 3) Dr. Johny Shears office will schedule radiation treatments  When appointments need to be scheduled, you will be contacted by [facility] and/or [facility].  Questions?  Please do not hesitate to call Adrian Rue, RN, BSN, OCN at [phone] any questions or concerns.  Adrian Reynolds is your Oncology Nurse Navigator and is available to assist you while you're receiving your medical care at [facility].

## 2015-10-02 NOTE — Progress Notes (Signed)
Reason for Referral: Prostate cancer  HPI: Adrian Reynolds is a 75 year old gentleman currently of [place] where he lived the majority of his life. He is a gentleman with history of hypertension as well as AAA rupture and previous repair. He was found to have an elevated PSA of 5.69 and subsequently was evaluated by Dr. Jeffie Reynolds and subsequently underwent a prostate biopsy on 09/15/2015.The biopsy showed a Gleason score 4+3 = 7 in 7 out of 12 cores. He denied any symptoms associated with that including frequency, urgency or hesitancy. He continues to be in reasonable health and perform activities of daily living. 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  He does not report any headaches, blurry vision, syncope or seizures.He does not report any fevers or chills or sweats. He does not report any cough, wheezing or hemoptysis. He does not report any nausea, vomiting or abdominal pain. He does not report any frequency, urgency or hesitancy. He does not report any skeletal complaints.Remaining review of systems unremarkable.  Past Medical History  Diagnosis Date  . Hypertension   . Hyperlipidemia   . AAA (abdominal aortic aneurysm) ([place])   . Arthritis     left hip  . 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 Prostate cancer [facility])   :  Past Surgical History  Procedure Laterality Date  . Abdominal aortic aneurysm repair    . Hernia repair      Mesh ventral  . Prostate biopsy    :   Current outpatient prescriptions:  .  allopurinol (ZYLOPRIM) 300 MG tablet, Take 1 tablet (300 mg total) by mouth daily., Disp: 100 tablet, Rfl: 3 .  aspirin 81 MG tablet, Take 81 mg by mouth daily.  , Disp: , Rfl:  .  atorvastatin (LIPITOR) 20 MG tablet, Take 1 tablet (20 mg total) by mouth at bedtime., Disp: 100 tablet, Rfl: 3 .  lisinopril-hydrochlorothiazide (PRINZIDE,ZESTORETIC) 20-25 MG tablet, TAKE ONE TABLET EACH DAY, Disp: 90 tablet, Rfl: 3 .  NEOMYCIN-POLYMYXIN-HYDROCORTISONE  (CORTISPORIN) 1 % SOLN otic solution, Place 2 drops into the right ear every 8 (eight) hours., Disp: 10 mL, Rfl: 0 .  predniSONE (DELTASONE) 20 MG tablet, One tablet x 5 days or until your skin clears completely then, a half a tablet x 5 days, then half a tablet Monday, Wednesday, Friday, for two months, Disp: 50 tablet, Rfl: 2 .  sildenafil (REVATIO) 20 MG tablet, Take one tab at bedtime as needed, Disp: 20 tablet, Rfl: 10:  No Known Allergies:  Family History  Problem Relation Age of Onset  . Diabetes Father   . Stroke Father   . Coronary artery disease Father   . Cancer Neg Hx   :  Social History   Social History  . Marital Status: Married    Spouse Name: N/A  . Number of Children: N/A  . Years of Education: N/A   Occupational History  . Not on file.   Social History Main Topics  . Smoking status: Former Smoker    Types: Cigarettes    Quit date: 02/14/1991  . Smokeless tobacco: Never Used  . Alcohol Use: 0.0 oz/week    0 Standard drinks or equivalent per week     Comment: occ  . Drug Use: No  . Sexual Activity: Yes   Other Topics Concern  . Not on file   Social History Narrative  :  Pertinent items are noted in HPI.  Exam:  General appearance: alert and cooperative  appeared without distress. Head: Normocephalic, without obvious abnormality No oral ulcers or lesions. Neck: no adenopathy Back: negative Resp: clear to auscultation bilaterally Chest wall: no tenderness Cardio: regular rate and rhythm, S1, S2 normal, no murmur, click, rub or gallop GI: soft, non-tender; bowel sounds normal; no masses,  no organomegaly Extremities: extremities normal, atraumatic, no cyanosis or edema Pulses: 2+ and symmetric Skin: Skin color, texture, turgor normal. No rashes or lesions  CBC    Component Value Date/Time   WBC 8.4 08/24/2015 1021   RBC 4.85 08/24/2015 1021   HGB 15.8 08/24/2015 1021   HCT 46.5 08/24/2015 1021   PLT 211.0 08/24/2015 1021   MCV 95.8  08/24/2015 1021   MCHC 34.1 08/24/2015 1021   RDW 13.5 08/24/2015 1021   LYMPHSABS 2.3 08/24/2015 1021   MONOABS 0.6 08/24/2015 1021   EOSABS 0.5 08/24/2015 1021   BASOSABS 0.0 08/24/2015 1021    EXAM: CTA ABDOMEN AND PELVIS wITHOUT AND WITH CONTRAST  TECHNIQUE: Multidetector CT imaging of the abdomen and pelvis was performed using the standard protocol during bolus administration of intravenous contrast. Multiplanar reconstructed images and MIPs were obtained and reviewed to evaluate the vascular anatomy.  CONTRAST: 75 cc Isovue 370  COMPARISON: 02/14/2012  FINDINGS: Aorto bi-iliac stent graft hardware is stable in position. Maximal AP and lateral aortic diameters surrounding the iliac limbs of the stent graft distally are 3.3 and 3.3 cm respectively. Maximal AP and transverse diameters of the native aorta at the level just above the renal arteries are 4.6 and 4.6 cm respectively. Previously, the maximal diameter was 4.2 cm. There is irregular posterior partially circumferential plaque along the aneurysmal portion of the aorta. There is no evidence of endoleak. Aortic lumen is patent.  Severe narrowing of the celiac axis has progressed. Branch vessels are patent. Pancreatic collateral vessels remain hypertrophied.  SMA is patent. Branch vessels are patent. Replaced right hepatic artery  IMA origin has been sacrificed. Branch vessels reconstitute  Single renal arteries are patent  Right iliac graft limb is patent. Landing zone occurs in the external iliac artery, excluding a right common iliac artery aneurysm. Maximal aneurysm sac diameter is 2.0 cm. There is no evidence of endoleak. The right internal iliac artery has been coil embolized and its origin remains occluded. Branch vessels reconstitute. Right external iliac artery is patent.  Left iliac lymph is patent. Landing zone in the left common iliac artery is patent. Maximal left common iliac artery  diameter distally is 2.1 cm this is slightly enlarged based on my direct measurements of 2.0 cm on the prior study. Left internal iliac artery is ectatic with a maximal diameter of 1.6 cm. This has increased from 1.4 cm. Left external iliac artery is patent.  Proximal femoral arteries are patent  Liver, gallbladder, spleen, pancreas, adrenal glands, and left kidney are within normal limits. Benign cyst in the right kidney.  Umbilical hernia contains adipose  Prostate is enlarged. Diffuse nonspecific bladder wall thickening.  No free-fluid. No obvious retroperitoneal adenopathy. Small gastrohepatic ligament nodes.  Stable L2 compression deformity. 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  Review of the MIP images confirms the above findings.  IMPRESSION: Aorto bi-iliac stent graft remains patent. There is no endoleak  Abdominal aortic aneurysm, just above the renal arteries in the native aorta has increased from 4.2 cm to 4.6 cm. Recommend followup by abdomen and pelvis CTA in 6 months, and vascular surgery referral/consultation if not already  obtained. This recommendation follows ACR consensus guidelines: White Paper of the ACR Incidental Findings Committee II on Vascular Findings. J Am Coll Radiol 2013; 10:789-794.   Assessment and Plan:   75 year old with prostate cancer diagnosed in April 2017. He has a Gleason score of 4+3 = 7 with the majority of the cores. He has 7 out of 12 cores involved the majority of 4+3 = 7.His PSA was 5.69 and his CT scan in October 2016 did not show any evidence of metastatic disease.  His case was discussed today in the prostate cancer multidisciplinary clinic. Imaging studies were reviewed with radiology and his pathology specimen was discussed with the reviewing pathologist.  He recommendation of the clinic at this point is to repeat imaging studies  including CT scan of the abdomen and pelvis for evaluation for metastatic disease.if the CT scan shows no evidence of disease, definitive local therapy would be indicated. Given his previous history of AAA repair, he would require open prostatectomy which carries a higher risk compared to minimally invasive procedure. Alternatively, he would be a better candidate for definitive radiation therapy with androgen deprivation. Complications associated with this therapy was reviewed today including weight gain, hot flashes, erectile dysfunction, among others. He will think about his options today and make his decision in the near future. I see no indication for any other systemic therapy at this time.

## 2015-10-06 ENCOUNTER — Telehealth: Payer: Self-pay | Admitting: *Deleted

## 2015-10-06 NOTE — Telephone Encounter (Signed)
Received fax from [facility] stating findings of patient being diagnosed with prostate cancer. Documentation sent to scan.

## 2015-10-14 DIAGNOSIS — C61 Malignant neoplasm of prostate: Secondary | ICD-10-CM | POA: Diagnosis not present

## 2015-10-15 ENCOUNTER — Encounter: Payer: Self-pay | Admitting: Medical Oncology

## 2015-10-22 ENCOUNTER — Other Ambulatory Visit: Payer: Self-pay | Admitting: Urology

## 2015-10-22 DIAGNOSIS — C61 Malignant neoplasm of prostate: Secondary | ICD-10-CM

## 2015-10-22 DIAGNOSIS — Z79899 Other long term (current) drug therapy: Secondary | ICD-10-CM

## 2015-10-30 ENCOUNTER — Encounter: Payer: Self-pay | Admitting: Medical Oncology

## 2015-10-30 DIAGNOSIS — C61 Malignant neoplasm of prostate: Secondary | ICD-10-CM | POA: Diagnosis not present

## 2015-11-02 ENCOUNTER — Ambulatory Visit
Admission: RE | Admit: 2015-11-02 | Discharge: 2015-11-02 | Disposition: A | Discharge status: Home / Self Care | Payer: Medicare Other | Source: Ambulatory Visit | Attending: Urology | Admitting: Urology

## 2015-11-02 DIAGNOSIS — Z79899 Other long term (current) drug therapy: Secondary | ICD-10-CM

## 2015-11-02 DIAGNOSIS — M85851 Other specified disorders of bone density and structure, right thigh: Secondary | ICD-10-CM | POA: Diagnosis not present

## 2015-11-02 DIAGNOSIS — C61 Malignant neoplasm of prostate: Secondary | ICD-10-CM

## 2015-11-04 ENCOUNTER — Encounter: Payer: Self-pay | Admitting: General Practice

## 2015-11-04 NOTE — Progress Notes (Signed)
Spiritual Care Note  Reached Adrian Reynolds by phone to f/u and introduce [facility] after [facility].  Adrian Reynolds was very upbeat, sharing in detail his gratitude for the comprehensive, quality care of [facility].  He feels very comfortable with and confident in his team and plans to reach out when he is on campus for tx (not scheduled yet).  Please also page if needs arise/circumstances change.  Thank you.  [place], [place], [facility] Pager [phone] Voicemail [phone]

## 2015-11-06 ENCOUNTER — Telehealth: Payer: Self-pay | Admitting: Medical Oncology

## 2015-11-06 NOTE — Telephone Encounter (Signed)
Oncology Nurse Navigator Documentation  Oncology Nurse Navigator Flowsheets 10/01/2015 10/02/2015 11/06/2015  Navigator Location - - -  Navigator Encounter Type Telephone Clinic/[facility] Telephone  Telephone Appt Confirmation/Clarification - Outgoing Call;Clinic/[facility] Follow-up- Spoke with Adrian Reynolds and she states that her husband is doing well. He has only had one hot flash since receiving his Androgen Deprivation 6/6. He will return for CT simulation 12/16/15. I asked her to call me with any questions or concerns. She voiced understanding.  Abnormal Finding Date - - -  Confirmed Diagnosis Date - - -  Barriers/Navigation Needs - Education No barriers at this time  Education - Occupational psychologist Treatment Options -  Interventions - Education Method None required  Education Method - Teach-back;Verbal;Written -  Support Groups/Services - Prostate Support Group;Friends and Family Friends and Family  Acuity - Level 1 -  Acuity Level 1 - Initial guidance, education and coordination as needed -  Time Spent with Patient 15 > 120 15

## 2015-12-01 DIAGNOSIS — C61 Malignant neoplasm of prostate: Secondary | ICD-10-CM | POA: Diagnosis not present

## 2015-12-04 DIAGNOSIS — C61 Malignant neoplasm of prostate: Secondary | ICD-10-CM | POA: Diagnosis not present

## 2015-12-16 DIAGNOSIS — C61 Malignant neoplasm of prostate: Secondary | ICD-10-CM | POA: Diagnosis not present

## 2015-12-23 NOTE — Progress Notes (Signed)
  Radiation Oncology         [phone] ________________________________  Name: Adrian Reynolds MRN: [id number]  Date: 12/25/2015  DOB: 1940/07/02  SIMULATION AND TREATMENT PLANNING NOTE    ICD-9-CM ICD-10-CM   1. Malignant neoplasm of prostate ([place]) 185 C61     DIAGNOSIS:  75 y.o. 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  Identity was confirmed.  All relevant records and images related to the planned course of therapy were reviewed.  The patient freely provided informed written consent to proceed with treatment after reviewing the details related to the planned course of therapy. The consent form was witnessed and verified by the simulation staff.  Then, the patient was set-up in a stable reproducible supine position for radiation therapy.  A vacuum lock pillow device was custom fabricated to position his legs in a reproducible immobilized position.  Then, I performed a urethrogram under sterile conditions to identify the prostatic apex.  CT images were obtained.  Surface markings were placed.  The CT images were loaded into the planning software.  Then the prostate target and avoidance structures including the rectum, bladder, bowel and hips were contoured.  Treatment planning then occurred.  The radiation prescription was entered and confirmed.  A total of one complex treatment device was fabricated. I have requested : Intensity Modulated Radiotherapy (IMRT) is medically necessary for this case for the following reason:  Rectal sparing.Marland Kitchen  PLAN:  The patient will receive 78 Gy in 40 fractions.  ________________________________  Sheral Apley Tammi Klippel, M.D.

## 2015-12-25 ENCOUNTER — Ambulatory Visit
Admission: RE | Admit: 2015-12-25 | Discharge: 2015-12-25 | Disposition: A | Discharge status: Home / Self Care | Payer: Medicare Other | Source: Ambulatory Visit | Attending: Radiation Oncology | Admitting: Radiation Oncology

## 2015-12-25 DIAGNOSIS — C61 Malignant neoplasm of prostate: Secondary | ICD-10-CM | POA: Insufficient documentation

## 2015-12-25 DIAGNOSIS — Z510 Encounter for antineoplastic radiation therapy: Secondary | ICD-10-CM | POA: Diagnosis not present

## 2015-12-29 DIAGNOSIS — Z510 Encounter for antineoplastic radiation therapy: Secondary | ICD-10-CM | POA: Diagnosis not present

## 2015-12-29 DIAGNOSIS — C61 Malignant neoplasm of prostate: Secondary | ICD-10-CM | POA: Diagnosis not present

## 2015-12-30 DIAGNOSIS — Z510 Encounter for antineoplastic radiation therapy: Secondary | ICD-10-CM | POA: Diagnosis not present

## 2015-12-30 DIAGNOSIS — C61 Malignant neoplasm of prostate: Secondary | ICD-10-CM | POA: Diagnosis not present

## 2016-01-04 ENCOUNTER — Encounter: Payer: Self-pay | Admitting: Medical Oncology

## 2016-01-05 ENCOUNTER — Encounter: Payer: Self-pay | Admitting: Medical Oncology

## 2016-01-05 ENCOUNTER — Ambulatory Visit
Admission: RE | Admit: 2016-01-05 | Discharge: 2016-01-05 | Disposition: A | Discharge status: Home / Self Care | Payer: Medicare Other | Source: Ambulatory Visit | Attending: Radiation Oncology | Admitting: Radiation Oncology

## 2016-01-05 DIAGNOSIS — Z510 Encounter for antineoplastic radiation therapy: Secondary | ICD-10-CM | POA: Diagnosis not present

## 2016-01-05 DIAGNOSIS — C61 Malignant neoplasm of prostate: Secondary | ICD-10-CM | POA: Diagnosis not present

## 2016-01-06 ENCOUNTER — Ambulatory Visit
Admission: RE | Admit: 2016-01-06 | Discharge: 2016-01-06 | Disposition: A | Discharge status: Home / Self Care | Payer: Medicare Other | Source: Ambulatory Visit | Attending: Radiation Oncology | Admitting: Radiation Oncology

## 2016-01-06 DIAGNOSIS — C61 Malignant neoplasm of prostate: Secondary | ICD-10-CM | POA: Diagnosis not present

## 2016-01-06 DIAGNOSIS — Z510 Encounter for antineoplastic radiation therapy: Secondary | ICD-10-CM | POA: Diagnosis not present

## 2016-01-07 ENCOUNTER — Ambulatory Visit
Admission: RE | Admit: 2016-01-07 | Discharge: 2016-01-07 | Disposition: A | Discharge status: Home / Self Care | Payer: Medicare Other | Source: Ambulatory Visit | Attending: Radiation Oncology | Admitting: Radiation Oncology

## 2016-01-07 VITALS — BP 150/64 | HR 62 | Resp 16 | Wt 250.6 lb

## 2016-01-07 DIAGNOSIS — C61 Malignant neoplasm of prostate: Secondary | ICD-10-CM | POA: Diagnosis not present

## 2016-01-07 DIAGNOSIS — Z5111 Encounter for antineoplastic chemotherapy: Secondary | ICD-10-CM | POA: Diagnosis not present

## 2016-01-07 DIAGNOSIS — Z510 Encounter for antineoplastic radiation therapy: Secondary | ICD-10-CM | POA: Diagnosis not present

## 2016-01-07 NOTE — Progress Notes (Addendum)
Weight and vitals stable. Denies pain. Reports nocturia x 0. Denies dysuria or hematuria. Describes a strong steady urine stream. Reports on rare occasions difficulty emptying his bladder. Reports since hormone injections his urinary frequency is slightly more. Reports hot flashes from hormone injection. Romie Jumper calling [facility] to determine when next hormone injection is scheduled for this patient. Denies incontinence or leakage. Denies urgency. Denies diarrhea. Denies fatigue.   BP (!) 150/64   Pulse 62   Resp 16   Wt 250 lb 9.6 oz (113.7 kg)   SpO2 100%   BMI 4894.20 kg/m  Wt Readings from Last 3 Encounters:  01/07/16 250 lb 9.6 oz (113.7 kg)  10/02/15 247 lb 3.2 oz (112.1 kg)  08/24/15 242 lb (109.8 kg)

## 2016-01-07 NOTE — Progress Notes (Signed)
  Radiation Oncology         [phone]   Name: Adrian Reynolds MRN: [id number]   Date: 01/07/2016  DOB: [birth year]     Weekly Radiation Therapy Management    ICD-9-CM ICD-10-CM   1. Malignant neoplasm of prostate ([facility]) 185 C61     Current Dose: 5.85 Gy  Planned Dose:  78 Gy  Narrative The patient presents for routine under treatment assessment.  Weight and vitals stable. Denies pain. Reports nocturia x 0. Denies dysuria and hematuria. Describes a strong steady urine stream. Reports on rare occasions difficulty emptying his bladder. He has been drinking more water than usual. Reports since hormone injections his urinary frequency is slightly more. Reports hot flashes from hormone injection. Denies diarrhea. Denies fatigue. He will receive his next hormone injection after our visit today.   The patient is without complaint. Set-up films were reviewed. The chart was checked.  Physical Findings  weight is 250 lb 9.6 oz (113.7 kg). His blood pressure is 150/64 (abnormal) and his pulse is 62. His respiration is 16 and oxygen saturation is 100%. . Weight essentially stable.  No significant changes.  Impression The patient is tolerating radiation.  Plan Continue treatment as planned.         Sheral Apley Tammi Klippel, M.D.  This document serves as a record of services personally performed by Tyler Pita, MD. It was created on his behalf by Arlyce Harman, a trained medical scribe. The creation of this record is based on the scribe's personal observations and the provider's statements to them. This document has been checked and approved by the attending provider.

## 2016-01-08 ENCOUNTER — Ambulatory Visit
Admission: RE | Admit: 2016-01-08 | Discharge: 2016-01-08 | Disposition: A | Discharge status: Home / Self Care | Payer: Medicare Other | Source: Ambulatory Visit | Attending: Radiation Oncology | Admitting: Radiation Oncology

## 2016-01-08 DIAGNOSIS — C61 Malignant neoplasm of prostate: Secondary | ICD-10-CM | POA: Diagnosis not present

## 2016-01-08 DIAGNOSIS — Z510 Encounter for antineoplastic radiation therapy: Secondary | ICD-10-CM | POA: Diagnosis not present

## 2016-01-11 ENCOUNTER — Ambulatory Visit
Admission: RE | Admit: 2016-01-11 | Discharge: 2016-01-11 | Disposition: A | Discharge status: Home / Self Care | Payer: Medicare Other | Source: Ambulatory Visit | Attending: Radiation Oncology | Admitting: Radiation Oncology

## 2016-01-11 DIAGNOSIS — C61 Malignant neoplasm of prostate: Secondary | ICD-10-CM | POA: Diagnosis not present

## 2016-01-11 DIAGNOSIS — Z510 Encounter for antineoplastic radiation therapy: Secondary | ICD-10-CM | POA: Diagnosis not present

## 2016-01-12 ENCOUNTER — Ambulatory Visit
Admission: RE | Admit: 2016-01-12 | Discharge: 2016-01-12 | Disposition: A | Discharge status: Home / Self Care | Payer: Medicare Other | Source: Ambulatory Visit | Attending: Radiation Oncology | Admitting: Radiation Oncology

## 2016-01-12 DIAGNOSIS — C61 Malignant neoplasm of prostate: Secondary | ICD-10-CM | POA: Diagnosis not present

## 2016-01-12 DIAGNOSIS — Z510 Encounter for antineoplastic radiation therapy: Secondary | ICD-10-CM | POA: Diagnosis not present

## 2016-01-13 ENCOUNTER — Ambulatory Visit
Admission: RE | Admit: 2016-01-13 | Discharge: 2016-01-13 | Disposition: A | Discharge status: Home / Self Care | Payer: Medicare Other | Source: Ambulatory Visit | Attending: Radiation Oncology | Admitting: Radiation Oncology

## 2016-01-13 DIAGNOSIS — Z510 Encounter for antineoplastic radiation therapy: Secondary | ICD-10-CM | POA: Diagnosis not present

## 2016-01-13 DIAGNOSIS — C61 Malignant neoplasm of prostate: Secondary | ICD-10-CM | POA: Diagnosis not present

## 2016-01-14 ENCOUNTER — Ambulatory Visit
Admission: RE | Admit: 2016-01-14 | Discharge: 2016-01-14 | Disposition: A | Discharge status: Home / Self Care | Payer: Medicare Other | Source: Ambulatory Visit | Attending: Radiation Oncology | Admitting: Radiation Oncology

## 2016-01-14 ENCOUNTER — Ambulatory Visit: Payer: Medicare Other | Admitting: Radiation Oncology

## 2016-01-14 DIAGNOSIS — C61 Malignant neoplasm of prostate: Secondary | ICD-10-CM | POA: Diagnosis not present

## 2016-01-14 DIAGNOSIS — Z510 Encounter for antineoplastic radiation therapy: Secondary | ICD-10-CM | POA: Diagnosis not present

## 2016-01-15 ENCOUNTER — Ambulatory Visit
Admission: RE | Admit: 2016-01-15 | Discharge: 2016-01-15 | Disposition: A | Discharge status: Home / Self Care | Payer: Medicare Other | Source: Ambulatory Visit | Attending: Radiation Oncology | Admitting: Radiation Oncology

## 2016-01-15 VITALS — BP 141/76 | HR 59 | Temp 98.1°F | Wt 254.6 lb

## 2016-01-15 DIAGNOSIS — C61 Malignant neoplasm of prostate: Secondary | ICD-10-CM

## 2016-01-15 DIAGNOSIS — Z510 Encounter for antineoplastic radiation therapy: Secondary | ICD-10-CM | POA: Diagnosis not present

## 2016-01-15 NOTE — Progress Notes (Signed)
Weekly rad tx prostate 9/40 completed,. Bladder and bowels normal no c/o appetite good, no fatigue 8:43 AM BP (!) 141/76 (BP Location: Left Arm, Patient Position: Sitting, Cuff Size: Normal)   Pulse (!) 59   Temp 98.1 F (36.7 C) (Oral)   Wt 254 lb 9.6 oz (115.5 kg)   BMI 4972.32 kg/m   Wt Readings from Last 3 Encounters:  01/15/16 254 lb 9.6 oz (115.5 kg)  01/07/16 250 lb 9.6 oz (113.7 kg)  10/02/15 247 lb 3.2 oz (112.1 kg)

## 2016-01-15 NOTE — Progress Notes (Signed)
   Department of Radiation Oncology  Phone:  [phone] Fax:        [phone]  Weekly Treatment Note    Name: Adrian Reynolds Date: 01/17/2016 MRN: [id number] DOB: [birth year]   Diagnosis:     ICD-9-CM ICD-10-CM   1. Malignant neoplasm of prostate ([place]) 185 C61      Current dose: 17.55 Gy  Current fraction:9   MEDICATIONS: Current Outpatient Prescriptions  Medication Sig Dispense Refill  . allopurinol (ZYLOPRIM) 300 MG tablet Take 1 tablet (300 mg total) by mouth daily. 100 tablet 3  . aspirin 81 MG tablet Take 81 mg by mouth daily.      Marland Kitchen atorvastatin (LIPITOR) 20 MG tablet Take 1 tablet (20 mg total) by mouth at bedtime. 100 tablet 3  . lisinopril-hydrochlorothiazide (PRINZIDE,ZESTORETIC) 20-25 MG tablet TAKE ONE TABLET EACH DAY 90 tablet 3  . NEOMYCIN-POLYMYXIN-HYDROCORTISONE (CORTISPORIN) 1 % SOLN otic solution Place 2 drops into the right ear every 8 (eight) hours. 10 mL 0  . predniSONE (DELTASONE) 20 MG tablet One tablet x 5 days or until your skin clears completely then, a half a tablet x 5 days, then half a tablet Monday, Wednesday, Friday, for two months (Patient not taking: Reported on 01/15/2016) 50 tablet 2  . sildenafil (REVATIO) 20 MG tablet Take one tab at bedtime as needed (Patient not taking: Reported on 01/15/2016) 20 tablet 10   No current facility-administered medications for this encounter.      ALLERGIES: Review of patient's allergies indicates no known allergies.   LABORATORY DATA:  Lab Results  Component Value Date   WBC 8.4 08/24/2015   HGB 15.8 08/24/2015   HCT 46.5 08/24/2015   MCV 95.8 08/24/2015   PLT 211.0 08/24/2015   Lab Results  Component Value Date   NA 138 04/07/2015   K 4.9 04/07/2015   CL 102 04/07/2015   CO2 29 04/07/2015   Lab Results  Component Value Date   ALT 27 08/24/2015   AST 22 08/24/2015   ALKPHOS 50 08/24/2015   BILITOT 1.2 08/24/2015     NARRATIVE: Adrian Reynolds was seen today for weekly treatment  management. The chart was checked and the patient's films were reviewed.  The patient's bladder and bowels are normal. He has a good appetite and denies fatigue.  PHYSICAL EXAMINATION: weight is 254 lb 9.6 oz (115.5 kg). His oral temperature is 98.1 F (36.7 C). His blood pressure is 141/76 (abnormal) and his pulse is 59 (abnormal).   ASSESSMENT: The patient is doing satisfactorily with treatment.  PLAN: We will continue with the patient's radiation treatment as planned.  This document serves as a record of services personally performed by Kyung Rudd, MD. It was created on his behalf by Darcus Austin, a trained medical scribe. The creation of this record is based on the scribe's personal observations and the provider's statements to them. This document has been checked and approved by the attending provider.

## 2016-01-18 ENCOUNTER — Ambulatory Visit
Admission: RE | Admit: 2016-01-18 | Discharge: 2016-01-18 | Disposition: A | Discharge status: Home / Self Care | Payer: Medicare Other | Source: Ambulatory Visit | Attending: Radiation Oncology | Admitting: Radiation Oncology

## 2016-01-18 DIAGNOSIS — C61 Malignant neoplasm of prostate: Secondary | ICD-10-CM | POA: Diagnosis not present

## 2016-01-18 DIAGNOSIS — Z510 Encounter for antineoplastic radiation therapy: Secondary | ICD-10-CM | POA: Diagnosis not present

## 2016-01-19 ENCOUNTER — Ambulatory Visit
Admission: RE | Admit: 2016-01-19 | Discharge: 2016-01-19 | Disposition: A | Discharge status: Home / Self Care | Payer: Medicare Other | Source: Ambulatory Visit | Attending: Radiation Oncology | Admitting: Radiation Oncology

## 2016-01-19 DIAGNOSIS — Z510 Encounter for antineoplastic radiation therapy: Secondary | ICD-10-CM | POA: Diagnosis not present

## 2016-01-19 DIAGNOSIS — C61 Malignant neoplasm of prostate: Secondary | ICD-10-CM | POA: Diagnosis not present

## 2016-01-20 ENCOUNTER — Ambulatory Visit
Admission: RE | Admit: 2016-01-20 | Discharge: 2016-01-20 | Disposition: A | Discharge status: Home / Self Care | Payer: Medicare Other | Source: Ambulatory Visit | Attending: Radiation Oncology | Admitting: Radiation Oncology

## 2016-01-20 ENCOUNTER — Encounter: Payer: Self-pay | Admitting: Medical Oncology

## 2016-01-20 DIAGNOSIS — C61 Malignant neoplasm of prostate: Secondary | ICD-10-CM | POA: Diagnosis not present

## 2016-01-20 DIAGNOSIS — Z510 Encounter for antineoplastic radiation therapy: Secondary | ICD-10-CM | POA: Diagnosis not present

## 2016-01-21 ENCOUNTER — Ambulatory Visit
Admission: RE | Admit: 2016-01-21 | Discharge: 2016-01-21 | Disposition: A | Discharge status: Home / Self Care | Payer: Medicare Other | Source: Ambulatory Visit | Attending: Radiation Oncology | Admitting: Radiation Oncology

## 2016-01-21 ENCOUNTER — Encounter: Payer: Self-pay | Admitting: Radiation Oncology

## 2016-01-21 VITALS — BP 122/77 | HR 58 | Temp 98.0°F | Resp 10 | Wt 254.7 lb

## 2016-01-21 DIAGNOSIS — C61 Malignant neoplasm of prostate: Secondary | ICD-10-CM

## 2016-01-21 DIAGNOSIS — Z510 Encounter for antineoplastic radiation therapy: Secondary | ICD-10-CM | POA: Diagnosis not present

## 2016-01-21 NOTE — Progress Notes (Signed)
  Radiation Oncology         [phone]   Name: Adrian Reynolds MRN: [id number]   Date: 01/21/2016  DOB: [birth year]     Weekly Radiation Therapy Management    ICD-9-CM ICD-10-CM   1. Malignant neoplasm of prostate ([place]) 185 C61     Current Dose: 21.35Gy  Planned Dose:  78 Gy  Narrative The patient presents for routine under treatment assessment.  Patient reports no pain. Patient reports urinary frequency, retention, hesitancy, hot flashes and dribbling. Patient states nocturia 2-3x. Patient reports soft bowel movement everyday/ every other day. Patient reports increased sleeping. The patient is without complaint. Set-up films were reviewed. The chart was checked.  Physical Findings  weight is 254 lb 11.2 oz (115.5 kg). His oral temperature is 98 F (36.7 C). His blood pressure is 122/77 and his pulse is 58 (abnormal). His respiration is 10 and oxygen saturation is 98%. . Weight essentially stable.  No significant changes.  Impression The patient is tolerating radiation.  Plan Continue treatment as planned.         Sheral Apley Tammi Klippel, M.D.  This document serves as a record of services personally performed by Tyler Pita, MD. It was created on his behalf by Bethann Humble, a trained medical scribe. The creation of this record is based on the scribe's personal observations and the provider's statements to them. This document has been checked and approved by the attending provider.

## 2016-01-21 NOTE — Progress Notes (Signed)
PAIN: He is currently in no pain.  URINARY: Pt reports urinary frequency, retention, hesistency, hot flashes and dribbling. Pt states they urinate 2 - 3 times per night.  BOWEL: Pt reports a soft bowel movement everyday/everyother day. BP 122/77   Pulse (!) 58   Temp 98 F (36.7 C) (Oral)   Resp 10   Wt 254 lb 11.2 oz (115.5 kg)   SpO2 98%   BMI 4974.27 kg/m  Wt Readings from Last 3 Encounters:  01/21/16 254 lb 11.2 oz (115.5 kg)  01/15/16 254 lb 9.6 oz (115.5 kg)  01/07/16 250 lb 9.6 oz (113.7 kg)

## 2016-01-22 ENCOUNTER — Ambulatory Visit
Admission: RE | Admit: 2016-01-22 | Discharge: 2016-01-22 | Disposition: A | Discharge status: Home / Self Care | Payer: Medicare Other | Source: Ambulatory Visit | Attending: Radiation Oncology | Admitting: Radiation Oncology

## 2016-01-22 DIAGNOSIS — C61 Malignant neoplasm of prostate: Secondary | ICD-10-CM | POA: Diagnosis not present

## 2016-01-22 DIAGNOSIS — Z510 Encounter for antineoplastic radiation therapy: Secondary | ICD-10-CM | POA: Diagnosis not present

## 2016-01-26 ENCOUNTER — Ambulatory Visit
Admission: RE | Admit: 2016-01-26 | Discharge: 2016-01-26 | Disposition: A | Discharge status: Home / Self Care | Payer: Medicare Other | Source: Ambulatory Visit | Attending: Radiation Oncology | Admitting: Radiation Oncology

## 2016-01-26 DIAGNOSIS — C61 Malignant neoplasm of prostate: Secondary | ICD-10-CM | POA: Diagnosis not present

## 2016-01-26 DIAGNOSIS — Z510 Encounter for antineoplastic radiation therapy: Secondary | ICD-10-CM | POA: Diagnosis not present

## 2016-01-27 ENCOUNTER — Ambulatory Visit
Admission: RE | Admit: 2016-01-27 | Discharge: 2016-01-27 | Disposition: A | Discharge status: Home / Self Care | Payer: Medicare Other | Source: Ambulatory Visit | Attending: Radiation Oncology | Admitting: Radiation Oncology

## 2016-01-27 DIAGNOSIS — C61 Malignant neoplasm of prostate: Secondary | ICD-10-CM | POA: Diagnosis not present

## 2016-01-27 DIAGNOSIS — Z510 Encounter for antineoplastic radiation therapy: Secondary | ICD-10-CM | POA: Diagnosis not present

## 2016-01-28 ENCOUNTER — Ambulatory Visit
Admission: RE | Admit: 2016-01-28 | Discharge: 2016-01-28 | Disposition: A | Discharge status: Home / Self Care | Payer: Medicare Other | Source: Ambulatory Visit | Attending: Radiation Oncology | Admitting: Radiation Oncology

## 2016-01-28 ENCOUNTER — Encounter: Payer: Self-pay | Admitting: Radiation Oncology

## 2016-01-28 VITALS — BP 134/72 | HR 55 | Temp 98.2°F | Resp 18 | Ht <= 58 in | Wt 253.4 lb

## 2016-01-28 DIAGNOSIS — C61 Malignant neoplasm of prostate: Secondary | ICD-10-CM

## 2016-01-28 DIAGNOSIS — Z510 Encounter for antineoplastic radiation therapy: Secondary | ICD-10-CM | POA: Diagnosis not present

## 2016-01-28 NOTE — Progress Notes (Signed)
Weight and vitals stable. Denies pain. Reports nocturia x 1. Denies dysuria or hematuria. Describes a strong steady urine stream. Reports no promblems emptying his bladder. Reports since hormone injections his urinary frequency is slightly more. Reports hot flashes from hormone injection.  Denies incontinence or leakage. Denies urgency.  Rare diarrhea. Having  Fatigue. Wt Readings from Last 3 Encounters:  01/28/16 253 lb 6.4 oz (114.9 kg)  01/21/16 254 lb 11.2 oz (115.5 kg)  01/15/16 254 lb 9.6 oz (115.5 kg)  BP 134/72 (BP Location: Right Arm, Patient Position: Sitting, Cuff Size: Normal)   Pulse (!) 55   Temp 98.2 F (36.8 C)   Resp 18   Ht (!) 6" (0.152 m)   Wt 253 lb 6.4 oz (114.9 kg)   SpO2 100%   BMI 4948.88 kg/m

## 2016-01-28 NOTE — Progress Notes (Signed)
  Radiation Oncology         [phone]   Name: Adrian Reynolds MRN: [id number]   Date: 01/28/2016  DOB: Aug 30, 1940     Weekly Radiation Therapy Management    ICD-9-CM ICD-10-CM   1. Malignant neoplasm of prostate ([facility]) 185 C61     Current Dose: 33.15 Gy  Planned Dose:  78 Gy  Narrative The patient presents for routine under treatment assessment.  Weight and vitals stable. Denies pain. Reports nocturia x 1. Denies dysuria or hematuria. Describes a strong steady urine stream. Reports no problems emptying his bladder. Reports since hormone injections his urinary frequency is slightly more. Reports hot flashes from hormone injection. Denies incontinence or leakage. Denies urgency. Rare diarrhea. Reports fatigue.  The patient is without complaint. Set-up films were reviewed. The chart was checked.  Physical Findings  height is 6" (0.152 m) (abnormal) and weight is 253 lb 6.4 oz (114.9 kg). His temperature is 98.2 F (36.8 C). His blood pressure is 134/72 and his pulse is 55 (abnormal). His respiration is 18 and oxygen saturation is 100%. . Weight essentially stable.  No significant changes. Alert, in no acute distress.  Impression The patient is tolerating radiation.  Plan Continue treatment as planned.         Sheral Apley Tammi Klippel, M.D.  This document serves as a record of services personally performed by Tyler Pita, MD. It was created on his behalf by Arlyce Harman, a trained medical scribe. The creation of this record is based on the scribe's personal observations and the provider's statements to them. This document has been checked and approved by the attending provider.

## 2016-01-29 ENCOUNTER — Ambulatory Visit
Admission: RE | Admit: 2016-01-29 | Discharge: 2016-01-29 | Disposition: A | Discharge status: Home / Self Care | Payer: Medicare Other | Source: Ambulatory Visit | Attending: Radiation Oncology | Admitting: Radiation Oncology

## 2016-01-29 DIAGNOSIS — C61 Malignant neoplasm of prostate: Secondary | ICD-10-CM | POA: Diagnosis not present

## 2016-01-29 DIAGNOSIS — Z510 Encounter for antineoplastic radiation therapy: Secondary | ICD-10-CM | POA: Diagnosis not present

## 2016-02-01 ENCOUNTER — Ambulatory Visit
Admission: RE | Admit: 2016-02-01 | Discharge: 2016-02-01 | Disposition: A | Discharge status: Home / Self Care | Payer: Medicare Other | Source: Ambulatory Visit | Attending: Radiation Oncology | Admitting: Radiation Oncology

## 2016-02-01 DIAGNOSIS — C61 Malignant neoplasm of prostate: Secondary | ICD-10-CM | POA: Diagnosis not present

## 2016-02-01 DIAGNOSIS — Z510 Encounter for antineoplastic radiation therapy: Secondary | ICD-10-CM | POA: Diagnosis not present

## 2016-02-02 ENCOUNTER — Ambulatory Visit
Admission: RE | Admit: 2016-02-02 | Discharge: 2016-02-02 | Disposition: A | Discharge status: Home / Self Care | Payer: Medicare Other | Source: Ambulatory Visit | Attending: Radiation Oncology | Admitting: Radiation Oncology

## 2016-02-02 DIAGNOSIS — Z510 Encounter for antineoplastic radiation therapy: Secondary | ICD-10-CM | POA: Diagnosis not present

## 2016-02-02 DIAGNOSIS — C61 Malignant neoplasm of prostate: Secondary | ICD-10-CM | POA: Diagnosis not present

## 2016-02-03 ENCOUNTER — Ambulatory Visit
Admission: RE | Admit: 2016-02-03 | Discharge: 2016-02-03 | Disposition: A | Discharge status: Home / Self Care | Payer: Medicare Other | Source: Ambulatory Visit | Attending: Radiation Oncology | Admitting: Radiation Oncology

## 2016-02-03 DIAGNOSIS — Z510 Encounter for antineoplastic radiation therapy: Secondary | ICD-10-CM | POA: Diagnosis not present

## 2016-02-03 DIAGNOSIS — C61 Malignant neoplasm of prostate: Secondary | ICD-10-CM | POA: Diagnosis not present

## 2016-02-04 ENCOUNTER — Ambulatory Visit
Admission: RE | Admit: 2016-02-04 | Discharge: 2016-02-04 | Disposition: A | Discharge status: Home / Self Care | Payer: Medicare Other | Source: Ambulatory Visit | Attending: Radiation Oncology | Admitting: Radiation Oncology

## 2016-02-04 DIAGNOSIS — C61 Malignant neoplasm of prostate: Secondary | ICD-10-CM | POA: Diagnosis not present

## 2016-02-04 DIAGNOSIS — Z510 Encounter for antineoplastic radiation therapy: Secondary | ICD-10-CM | POA: Diagnosis not present

## 2016-02-05 ENCOUNTER — Ambulatory Visit
Admission: RE | Admit: 2016-02-05 | Discharge: 2016-02-05 | Disposition: A | Discharge status: Home / Self Care | Payer: Medicare Other | Source: Ambulatory Visit | Attending: Radiation Oncology | Admitting: Radiation Oncology

## 2016-02-05 VITALS — BP 146/75 | HR 56 | Resp 16 | Wt 253.0 lb

## 2016-02-05 DIAGNOSIS — C61 Malignant neoplasm of prostate: Secondary | ICD-10-CM

## 2016-02-05 DIAGNOSIS — Z510 Encounter for antineoplastic radiation therapy: Secondary | ICD-10-CM | POA: Diagnosis not present

## 2016-02-05 NOTE — Progress Notes (Signed)
  Radiation Oncology         [phone]   Name: Adrian Reynolds MRN: [id number]   Date: 02/05/2016  DOB: [birth year]     Weekly Radiation Therapy Management    ICD-9-CM ICD-10-CM   1. Malignant neoplasm of prostate ([facility]) 185 C61     Current Dose: 44.85 Gy  Planned Dose:  78 Gy  Narrative The patient presents for routine under treatment assessment.  Denies pain. Reports nocturia x 1. Denies dysuria, hematuria, incontinence, or leakage. Denies difficulty emptying his bladder. Describes his urine stream as strong and steady. Reports rare diarrhea. Reports mild fatigue by end of the day.  Set-up films were reviewed. The chart was checked.  Physical Findings  weight is 253 lb (114.8 kg). His blood pressure is 146/75 (abnormal) and his pulse is 56 (abnormal). His respiration is 16 and oxygen saturation is 100%. . Weight essentially stable.  No significant changes. Alert, in no acute distress.  Impression The patient is tolerating radiation.  Plan Continue treatment as planned.    Sheral Apley Tammi Klippel, M.D.  This document serves as a record of services personally performed by Tyler Pita, MD. It was created on his behalf by Darcus Austin, a trained medical scribe. The creation of this record is based on the scribe's personal observations and the provider's statements to them. This document has been checked and approved by the attending provider.

## 2016-02-05 NOTE — Progress Notes (Signed)
Weight and vitals stable. Denies pain. Reports nocturia x 1. Denies dysuria, hematuria, incontinence, or leakage. Denies difficulty emptying his bladder. Describes his urine stream as strong and steady. Reports rare diarrhea. Reports mild fatigue by end of day.   BP (!) 146/75 (BP Location: Left Arm, Patient Position: Sitting, Cuff Size: Large)   Pulse (!) 56   Resp 16   Wt 253 lb (114.8 kg)   SpO2 100%   BMI 4941.07 kg/m  Wt Readings from Last 3 Encounters:  02/05/16 253 lb (114.8 kg)  01/28/16 253 lb 6.4 oz (114.9 kg)  01/21/16 254 lb 11.2 oz (115.5 kg)

## 2016-02-08 ENCOUNTER — Ambulatory Visit
Admission: RE | Admit: 2016-02-08 | Discharge: 2016-02-08 | Disposition: A | Discharge status: Home / Self Care | Payer: Medicare Other | Source: Ambulatory Visit | Attending: Radiation Oncology | Admitting: Radiation Oncology

## 2016-02-08 DIAGNOSIS — Z510 Encounter for antineoplastic radiation therapy: Secondary | ICD-10-CM | POA: Diagnosis not present

## 2016-02-08 DIAGNOSIS — C61 Malignant neoplasm of prostate: Secondary | ICD-10-CM | POA: Diagnosis not present

## 2016-02-09 ENCOUNTER — Ambulatory Visit
Admission: RE | Admit: 2016-02-09 | Discharge: 2016-02-09 | Disposition: A | Discharge status: Home / Self Care | Payer: Medicare Other | Source: Ambulatory Visit | Attending: Radiation Oncology | Admitting: Radiation Oncology

## 2016-02-09 DIAGNOSIS — C61 Malignant neoplasm of prostate: Secondary | ICD-10-CM | POA: Diagnosis not present

## 2016-02-09 DIAGNOSIS — Z510 Encounter for antineoplastic radiation therapy: Secondary | ICD-10-CM | POA: Diagnosis not present

## 2016-02-10 ENCOUNTER — Ambulatory Visit
Admission: RE | Admit: 2016-02-10 | Discharge: 2016-02-10 | Disposition: A | Discharge status: Home / Self Care | Payer: Medicare Other | Source: Ambulatory Visit | Attending: Radiation Oncology | Admitting: Radiation Oncology

## 2016-02-10 DIAGNOSIS — Z510 Encounter for antineoplastic radiation therapy: Secondary | ICD-10-CM | POA: Diagnosis not present

## 2016-02-10 DIAGNOSIS — C61 Malignant neoplasm of prostate: Secondary | ICD-10-CM | POA: Diagnosis not present

## 2016-02-11 ENCOUNTER — Ambulatory Visit
Admission: RE | Admit: 2016-02-11 | Discharge: 2016-02-11 | Disposition: A | Discharge status: Home / Self Care | Payer: Medicare Other | Source: Ambulatory Visit | Attending: Radiation Oncology | Admitting: Radiation Oncology

## 2016-02-11 DIAGNOSIS — Z510 Encounter for antineoplastic radiation therapy: Secondary | ICD-10-CM | POA: Diagnosis not present

## 2016-02-11 DIAGNOSIS — C61 Malignant neoplasm of prostate: Secondary | ICD-10-CM | POA: Diagnosis not present

## 2016-02-12 ENCOUNTER — Encounter: Payer: Self-pay | Admitting: Radiation Oncology

## 2016-02-12 ENCOUNTER — Ambulatory Visit
Admission: RE | Admit: 2016-02-12 | Discharge: 2016-02-12 | Disposition: A | Discharge status: Home / Self Care | Payer: Medicare Other | Source: Ambulatory Visit | Attending: Radiation Oncology | Admitting: Radiation Oncology

## 2016-02-12 ENCOUNTER — Encounter: Payer: Self-pay | Admitting: Medical Oncology

## 2016-02-12 VITALS — BP 143/74 | HR 62 | Resp 18 | Wt 252.3 lb

## 2016-02-12 DIAGNOSIS — Z510 Encounter for antineoplastic radiation therapy: Secondary | ICD-10-CM | POA: Diagnosis not present

## 2016-02-12 DIAGNOSIS — C61 Malignant neoplasm of prostate: Secondary | ICD-10-CM

## 2016-02-12 NOTE — Progress Notes (Signed)
Weight and vitals stable. Denies pain. Reports he received another ADT injection on Tuesday, Sept 19. Reports hot flashes. Reports nocturia x 1. Denies dysuria, hematuria, incontinence or leakage. Describes a strong steady urine stream without difficulty emptying. Denies any bowel complaints. Reports mild fatigue by end of day.   BP (!) 143/74 (BP Location: Right Arm, Patient Position: Sitting, Cuff Size: Large)   Pulse 62   Resp 18   Wt 252 lb 4.8 oz (114.4 kg)   SpO2 100%   BMI 4927.40 kg/m  Wt Readings from Last 3 Encounters:  02/12/16 252 lb 4.8 oz (114.4 kg)  02/05/16 253 lb (114.8 kg)  01/28/16 253 lb 6.4 oz (114.9 kg)

## 2016-02-12 NOTE — Progress Notes (Signed)
   Department of Radiation Oncology  Phone:  [phone] Fax:        [phone]  Weekly Treatment Note    Name: Adrian Reynolds Date: 02/13/2016 MRN: [id number] DOB: 1940/11/23   Diagnosis:     ICD-9-CM ICD-10-CM   1. Malignant neoplasm of prostate ([place]) 185 C61      Current dose: 54.6 Gy  Current fraction: 28   MEDICATIONS: Current Outpatient Prescriptions  Medication Sig Dispense Refill  . allopurinol (ZYLOPRIM) 300 MG tablet Take 1 tablet (300 mg total) by mouth daily. 100 tablet 3  . aspirin 81 MG tablet Take 81 mg by mouth daily.      Marland Kitchen atorvastatin (LIPITOR) 20 MG tablet Take 1 tablet (20 mg total) by mouth at bedtime. 100 tablet 3  . lisinopril-hydrochlorothiazide (PRINZIDE,ZESTORETIC) 20-25 MG tablet TAKE ONE TABLET EACH DAY 90 tablet 3  . NEOMYCIN-POLYMYXIN-HYDROCORTISONE (CORTISPORIN) 1 % SOLN otic solution Place 2 drops into the right ear every 8 (eight) hours. 10 mL 0  . predniSONE (DELTASONE) 20 MG tablet One tablet x 5 days or until your skin clears completely then, a half a tablet x 5 days, then half a tablet Monday, Wednesday, Friday, for two months 50 tablet 2  . sildenafil (REVATIO) 20 MG tablet Take one tab at bedtime as needed 20 tablet 10   No current facility-administered medications for this encounter.      ALLERGIES: Review of patient's allergies indicates no known allergies.   LABORATORY DATA:  Lab Results  Component Value Date   WBC 8.4 08/24/2015   HGB 15.8 08/24/2015   HCT 46.5 08/24/2015   MCV 95.8 08/24/2015   PLT 211.0 08/24/2015   Lab Results  Component Value Date   NA 138 04/07/2015   K 4.9 04/07/2015   CL 102 04/07/2015   CO2 29 04/07/2015   Lab Results  Component Value Date   ALT 27 08/24/2015   AST 22 08/24/2015   ALKPHOS 50 08/24/2015   BILITOT 1.2 08/24/2015     NARRATIVE: Adrian Reynolds was seen today for weekly treatment management. The chart was checked and the patient's films were reviewed.  Weight and  vitals stable. Denies pain. Reports he received another ADT injection on 02/09/16. Reports hot flashes. Reports nocturia x 1. Denies dysuria, hematuria, incontinence, or leakage. Describes a strong, steady urine stream without difficulty emptying. Denies any bowel complaints. Reports mild fatigue at the end of the day.  PHYSICAL EXAMINATION: weight is 252 lb 4.8 oz (114.4 kg). His blood pressure is 143/74 (abnormal) and his pulse is 62. His respiration is 18 and oxygen saturation is 100%.        ASSESSMENT: The patient is doing satisfactorily with treatment.  PLAN: We will continue with the patient's radiation treatment as planned.        This document serves as a record of services personally performed by Kyung Rudd, MD. It was created on his behalf by Bethann Humble, a trained medical scribe. The creation of this record is based on the scribe's personal observations and the provider's statements to them. This document has been checked and approved by the attending provider.

## 2016-02-12 NOTE — Progress Notes (Signed)
Mr. Caisse tolerating his radiation without any major side effects. He is having hot flashed from his androgen deprivation. Will continue to follow.

## 2016-02-15 ENCOUNTER — Ambulatory Visit
Admission: RE | Admit: 2016-02-15 | Discharge: 2016-02-15 | Disposition: A | Discharge status: Home / Self Care | Payer: Medicare Other | Source: Ambulatory Visit | Attending: Radiation Oncology | Admitting: Radiation Oncology

## 2016-02-15 DIAGNOSIS — Z510 Encounter for antineoplastic radiation therapy: Secondary | ICD-10-CM | POA: Diagnosis not present

## 2016-02-15 DIAGNOSIS — C61 Malignant neoplasm of prostate: Secondary | ICD-10-CM | POA: Diagnosis not present

## 2016-02-16 ENCOUNTER — Ambulatory Visit
Admission: RE | Admit: 2016-02-16 | Discharge: 2016-02-16 | Disposition: A | Discharge status: Home / Self Care | Payer: Medicare Other | Source: Ambulatory Visit | Attending: Radiation Oncology | Admitting: Radiation Oncology

## 2016-02-16 DIAGNOSIS — C61 Malignant neoplasm of prostate: Secondary | ICD-10-CM | POA: Diagnosis not present

## 2016-02-16 DIAGNOSIS — Z510 Encounter for antineoplastic radiation therapy: Secondary | ICD-10-CM | POA: Diagnosis not present

## 2016-02-17 ENCOUNTER — Ambulatory Visit
Admission: RE | Admit: 2016-02-17 | Discharge: 2016-02-17 | Disposition: A | Discharge status: Home / Self Care | Payer: Medicare Other | Source: Ambulatory Visit | Attending: Radiation Oncology | Admitting: Radiation Oncology

## 2016-02-17 DIAGNOSIS — C61 Malignant neoplasm of prostate: Secondary | ICD-10-CM | POA: Diagnosis not present

## 2016-02-17 DIAGNOSIS — Z510 Encounter for antineoplastic radiation therapy: Secondary | ICD-10-CM | POA: Diagnosis not present

## 2016-02-18 ENCOUNTER — Ambulatory Visit
Admission: RE | Admit: 2016-02-18 | Discharge: 2016-02-18 | Disposition: A | Discharge status: Home / Self Care | Payer: Medicare Other | Source: Ambulatory Visit | Attending: Radiation Oncology | Admitting: Radiation Oncology

## 2016-02-18 ENCOUNTER — Encounter: Payer: Self-pay | Admitting: Radiation Oncology

## 2016-02-18 VITALS — BP 130/65 | HR 63 | Temp 97.9°F | Resp 20 | Ht <= 58 in | Wt 256.2 lb

## 2016-02-18 DIAGNOSIS — Z510 Encounter for antineoplastic radiation therapy: Secondary | ICD-10-CM | POA: Diagnosis not present

## 2016-02-18 DIAGNOSIS — C61 Malignant neoplasm of prostate: Secondary | ICD-10-CM | POA: Diagnosis not present

## 2016-02-18 NOTE — Progress Notes (Signed)
Weight and vitals stable. Denies pain. Reports he received another Oct. 19, 2017 Reports hot flashes. Reports nocturia x 1. Denies dysuria, hematuria, incontinence or leakage. Describes a strong steady urine stream without difficulty emptying. Denies any bowel complaints. Reports mild fatigue by end of day.  Wt Readings from Last 3 Encounters:  02/18/16 256 lb 3.2 oz (116.2 kg)  02/12/16 252 lb 4.8 oz (114.4 kg)  02/05/16 253 lb (114.8 kg)  BP 130/65 (BP Location: Left Arm, Patient Position: Sitting, Cuff Size: Large)   Pulse 63   Temp 97.9 F (36.6 C) (Oral)   Resp 20   Ht (!) 6" (0.152 m)   Wt 256 lb 3.2 oz (116.2 kg)   SpO2 97%   BMI 5003.56 kg/m

## 2016-02-18 NOTE — Progress Notes (Signed)
  Radiation Oncology         [id number]   Name: Adrian Reynolds MRN: [id number]   Date: 02/18/2016  DOB: 07-04-1940    Weekly Radiation Therapy Management    ICD-9-CM ICD-10-CM   1. Malignant neoplasm of prostate ([facility]) 185 C61     Current Dose: 62.4 Gy  Planned Dose:  78 Gy  Narrative The patient presents for routine under treatment assessment.  Weight and vitals stable. Denies pain. Reports hot flashes. Reports nocturia x 1. Denies dysuria, hematuria, incontinence, or leakage. Describes a strong, steady urine stream without difficulty emptying. Denies any bowel complaints. Reports mild fatigue by the end of the day.  Set-up films were reviewed. The chart was checked.  Physical Findings  height is 6" (0.152 m) (abnormal) and weight is 256 lb 3.2 oz (116.2 kg). His oral temperature is 97.9 F (36.6 C). His blood pressure is 130/65 and his pulse is 63. His respiration is 20 and oxygen saturation is 97%. . Weight essentially stable.  No significant changes. Alert, in no acute distress.  Impression The patient is tolerating radiation.  Plan Continue treatment as planned.    Sheral Apley Tammi Klippel, M.D.  This document serves as a record of services personally performed by Tyler Pita, MD. It was created on his behalf by Bethann Humble, a trained medical scribe. The creation of this record is based on the scribe's personal observations and the provider's statements to them. This document has been checked and approved by the attending provider.

## 2016-02-19 ENCOUNTER — Encounter: Payer: Self-pay | Admitting: Medical Oncology

## 2016-02-19 ENCOUNTER — Ambulatory Visit
Admission: RE | Admit: 2016-02-19 | Discharge: 2016-02-19 | Disposition: A | Discharge status: Home / Self Care | Payer: Medicare Other | Source: Ambulatory Visit | Attending: Radiation Oncology | Admitting: Radiation Oncology

## 2016-02-19 DIAGNOSIS — Z510 Encounter for antineoplastic radiation therapy: Secondary | ICD-10-CM | POA: Diagnosis not present

## 2016-02-19 DIAGNOSIS — C61 Malignant neoplasm of prostate: Secondary | ICD-10-CM | POA: Diagnosis not present

## 2016-02-22 ENCOUNTER — Ambulatory Visit
Admission: RE | Admit: 2016-02-22 | Discharge: 2016-02-22 | Disposition: A | Discharge status: Home / Self Care | Payer: Medicare Other | Source: Ambulatory Visit | Attending: Radiation Oncology | Admitting: Radiation Oncology

## 2016-02-22 DIAGNOSIS — C61 Malignant neoplasm of prostate: Secondary | ICD-10-CM | POA: Diagnosis not present

## 2016-02-22 DIAGNOSIS — Z510 Encounter for antineoplastic radiation therapy: Secondary | ICD-10-CM | POA: Diagnosis not present

## 2016-02-23 ENCOUNTER — Ambulatory Visit
Admission: RE | Admit: 2016-02-23 | Discharge: 2016-02-23 | Disposition: A | Discharge status: Home / Self Care | Payer: Medicare Other | Source: Ambulatory Visit | Attending: Radiation Oncology | Admitting: Radiation Oncology

## 2016-02-23 DIAGNOSIS — Z510 Encounter for antineoplastic radiation therapy: Secondary | ICD-10-CM | POA: Diagnosis not present

## 2016-02-23 DIAGNOSIS — C61 Malignant neoplasm of prostate: Secondary | ICD-10-CM | POA: Diagnosis not present

## 2016-02-24 ENCOUNTER — Ambulatory Visit
Admission: RE | Admit: 2016-02-24 | Discharge: 2016-02-24 | Disposition: A | Discharge status: Home / Self Care | Payer: Medicare Other | Source: Ambulatory Visit | Attending: Radiation Oncology | Admitting: Radiation Oncology

## 2016-02-24 DIAGNOSIS — Z510 Encounter for antineoplastic radiation therapy: Secondary | ICD-10-CM | POA: Diagnosis not present

## 2016-02-24 DIAGNOSIS — C61 Malignant neoplasm of prostate: Secondary | ICD-10-CM | POA: Diagnosis not present

## 2016-02-25 ENCOUNTER — Ambulatory Visit
Admission: RE | Admit: 2016-02-25 | Discharge: 2016-02-25 | Disposition: A | Discharge status: Home / Self Care | Payer: Medicare Other | Source: Ambulatory Visit | Attending: Radiation Oncology | Admitting: Radiation Oncology

## 2016-02-25 DIAGNOSIS — C61 Malignant neoplasm of prostate: Secondary | ICD-10-CM | POA: Diagnosis not present

## 2016-02-25 DIAGNOSIS — Z510 Encounter for antineoplastic radiation therapy: Secondary | ICD-10-CM | POA: Diagnosis not present

## 2016-02-26 ENCOUNTER — Encounter: Payer: Self-pay | Admitting: Radiation Oncology

## 2016-02-26 ENCOUNTER — Ambulatory Visit
Admission: RE | Admit: 2016-02-26 | Discharge: 2016-02-26 | Disposition: A | Discharge status: Home / Self Care | Payer: Medicare Other | Source: Ambulatory Visit | Attending: Radiation Oncology | Admitting: Radiation Oncology

## 2016-02-26 VITALS — BP 153/85 | HR 63 | Temp 98.3°F | Resp 18 | Ht <= 58 in | Wt 256.6 lb

## 2016-02-26 DIAGNOSIS — C61 Malignant neoplasm of prostate: Secondary | ICD-10-CM | POA: Diagnosis not present

## 2016-02-26 DIAGNOSIS — Z510 Encounter for antineoplastic radiation therapy: Secondary | ICD-10-CM | POA: Diagnosis not present

## 2016-02-26 NOTE — Progress Notes (Signed)
  Radiation Oncology         [phone]   Name: Adrian Reynolds MRN: [id number]   Date: 02/26/2016  DOB: 15-Nov-1940    Weekly Radiation Therapy Management    ICD-9-CM ICD-10-CM   1. Malignant neoplasm of prostate ([facility]) 185 C61     Current Dose: 74.1 Gy  Planned Dose:  78 Gy  Narrative The patient presents for routine under treatment assessment.  Weight and vitals stable. Denies pain. Reports hot flashes. Reports nocturia x 1. Denies dysuria, hematuria, incontinence, or leakage. Describes a strong steady urine stream without difficulty emptying. Denies any bowel complaints. Reports mild fatigue by end of day. He will get another hormone shot later this month.  Set-up films were reviewed. The chart was checked.  Physical Findings  height is 6" (0.152 m) (abnormal) and weight is 256 lb 9.6 oz (116.4 kg). His oral temperature is 98.3 F (36.8 C). His blood pressure is 153/85 (abnormal) and his pulse is 63. His respiration is 18 and oxygen saturation is 97%. . Weight essentially stable.  No significant changes. Alert, in no acute distress.  Impression The patient is tolerating radiation.  Plan Continue treatment as planned. The patient will return in a month after he completes his treatment.    Sheral Apley Tammi Klippel, M.D.  This document serves as a record of services personally performed by Tyler Pita, MD. It was created on his behalf by Darcus Austin, a trained medical scribe. The creation of this record is based on the scribe's personal observations and the provider's statements to them. This document has been checked and approved by the attending provider.

## 2016-02-26 NOTE — Progress Notes (Signed)
Weight and vitals stable. Denies pain. Reports he received another Oct. 19, 2017 Reports hot flashes. Reports nocturia x 1. Denies dysuria, hematuria, incontinence or leakage. Describes a strong steady urine stream without difficulty emptying. Denies any bowel complaints. Reports mild fatigue by end of day.  Wt Readings from Last 3 Encounters:  02/26/16 256 lb 9.6 oz (116.4 kg)  02/18/16 256 lb 3.2 oz (116.2 kg)  02/12/16 252 lb 4.8 oz (114.4 kg)  BP (!) 153/85 (BP Location: Right Arm, Patient Position: Sitting, Cuff Size: Normal)   Pulse 63   Temp 98.3 F (36.8 C) (Oral)   Resp 18   Ht (!) 6" (0.152 m)   Wt 256 lb 9.6 oz (116.4 kg)   SpO2 97%   BMI 5011.38 kg/m

## 2016-02-29 ENCOUNTER — Ambulatory Visit
Admission: RE | Admit: 2016-02-29 | Discharge: 2016-02-29 | Disposition: A | Discharge status: Home / Self Care | Payer: Medicare Other | Source: Ambulatory Visit | Attending: Radiation Oncology | Admitting: Radiation Oncology

## 2016-02-29 DIAGNOSIS — C61 Malignant neoplasm of prostate: Secondary | ICD-10-CM | POA: Diagnosis not present

## 2016-02-29 DIAGNOSIS — Z510 Encounter for antineoplastic radiation therapy: Secondary | ICD-10-CM | POA: Diagnosis not present

## 2016-03-01 ENCOUNTER — Encounter: Payer: Self-pay | Admitting: Medical Oncology

## 2016-03-01 ENCOUNTER — Ambulatory Visit
Admission: RE | Admit: 2016-03-01 | Discharge: 2016-03-01 | Disposition: A | Discharge status: Home / Self Care | Payer: Medicare Other | Source: Ambulatory Visit | Attending: Radiation Oncology | Admitting: Radiation Oncology

## 2016-03-01 ENCOUNTER — Encounter: Payer: Self-pay | Admitting: Radiation Oncology

## 2016-03-01 DIAGNOSIS — C61 Malignant neoplasm of prostate: Secondary | ICD-10-CM | POA: Diagnosis not present

## 2016-03-01 DIAGNOSIS — Z510 Encounter for antineoplastic radiation therapy: Secondary | ICD-10-CM | POA: Diagnosis not present

## 2016-03-03 NOTE — Progress Notes (Signed)
Celebrated with Mr. Fragoso and his family as he rang the bell after completion of radiation treatments. He tolerated therapy well and will follow up in one month will Dr. Tammi Klippel.I asked him to call with any questions or concerns.

## 2016-03-04 NOTE — Progress Notes (Signed)
  Radiation Oncology         [phone] ________________________________  Name: Adrian Reynolds MRN: [id number]  Date: 03/01/2016  DOB: March 24, 1941  End of Treatment Note  Diagnosis: 75 y.o. 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  Image guidance was performed with daily cone beam CT prior to each fraction to align to gold markers in the prostate and assure proper bladder and rectal fill volumes.  Immobilization was achieved with BodyFix custom mold.  Narrative: The patient tolerated radiation treatment relatively well.   The patient experienced some minor urinary irritation including nocturia x 1, as well as modest fatigue.  Plan: The patient has completed radiation treatment. He will return to radiation oncology clinic for routine followup in one month. I advised him to call or return sooner if he has any questions or concerns related to his recovery or treatment. ________________________________  Sheral Apley. Tammi Klippel, M.D. This document serves as a record of services personally performed by Tyler Pita, MD. It was created on his behalf by Maryla Morrow, a trained medical scribe. The creation of this record is based on the scribe's personal observations and the provider's statements to them. This document has been checked and approved by the attending provider.

## 2016-03-21 ENCOUNTER — Ambulatory Visit
Admission: RE | Admit: 2016-03-21 | Discharge: 2016-03-21 | Disposition: A | Discharge status: Home / Self Care | Payer: Medicare Other | Source: Ambulatory Visit | Attending: Vascular Surgery | Admitting: Vascular Surgery

## 2016-03-21 DIAGNOSIS — I714 Abdominal aortic aneurysm, without rupture, unspecified: Secondary | ICD-10-CM

## 2016-03-21 MED ORDER — IOPAMIDOL (ISOVUE-370) INJECTION 76%
75.0000 mL | Freq: Once | INTRAVENOUS | Status: AC | PRN
  Administered 2016-03-21: 75 mL via INTRAVENOUS

## 2016-03-22 DIAGNOSIS — C61 Malignant neoplasm of prostate: Secondary | ICD-10-CM | POA: Diagnosis not present

## 2016-03-23 ENCOUNTER — Encounter: Payer: Self-pay | Admitting: Surgery

## 2016-03-28 ENCOUNTER — Ambulatory Visit (INDEPENDENT_AMBULATORY_CARE_PROVIDER_SITE_OTHER): Payer: Medicare Other | Admitting: Surgery

## 2016-03-28 ENCOUNTER — Encounter: Payer: Self-pay | Admitting: Surgery

## 2016-03-28 ENCOUNTER — Encounter (INDEPENDENT_AMBULATORY_CARE_PROVIDER_SITE_OTHER): Payer: Self-pay

## 2016-03-28 VITALS — BP 126/80 | HR 61 | Temp 98.0°F | Ht 72.0 in | Wt 252.0 lb

## 2016-03-28 DIAGNOSIS — I714 Abdominal aortic aneurysm, without rupture, unspecified: Secondary | ICD-10-CM

## 2016-03-28 NOTE — Progress Notes (Signed)
[facility] [place]  Patient name: Adrian Reynolds MRN: [id number] DOB: [birth year] Sex: male  REASON FOR VISIT: follow up  HPI: Adrian Reynolds is a 75 y.o. male returns today for follow-up.  He had previously been seeing Dr. Kellie Simmering.  He has a history of a ruptured abdominal aortic aneurysm repair several decades ago in [facility].  2009 he underwent coil embolization of the right internal iliac artery with an Amplatzer plug followed by endograft repair in order to treat a right common iliac aneurysm.he also had insertion of a Palm eyes stent.  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  He is without abdominal pain.  Patient is recently completed treatment for prostate cancer with radiation.  He is medically managed for hypercholesterolemia with a statin.  He is on ACE inhibitor for hypertension.  He takes an aspirin as antiplatelet therapy.  He is a former smoker.  Past Medical History:  Diagnosis Date  . AAA (abdominal aortic aneurysm) ([place])   . Arthritis    left hip  . Hyperlipidemia   . Hypertension   . 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 Prostate cancer [facility])     Family History  Problem Relation Age of Onset  . Diabetes Father   . Stroke Father   . Coronary artery disease Father   . Cancer Neg Hx     SOCIAL HISTORY: Social History  Substance Use Topics  . Smoking status: Former Smoker    Types: Cigarettes    Quit date: 02/14/1991  . Smokeless tobacco: Never Used  . Alcohol use 0.0 oz/week     Comment: occ    No Known Allergies  Current Outpatient Prescriptions  Medication Sig Dispense Refill  . allopurinol (ZYLOPRIM) 300 MG tablet Take 1 tablet (300 mg total) by mouth daily. 100 tablet 3  . aspirin 81 MG tablet Take 81 mg by mouth daily.      Marland Kitchen atorvastatin (LIPITOR) 20 MG tablet Take 1 tablet (20 mg total) by mouth at bedtime. 100 tablet 3  . lisinopril-hydrochlorothiazide  (PRINZIDE,ZESTORETIC) 20-25 MG tablet TAKE ONE TABLET EACH DAY 90 tablet 3  . NEOMYCIN-POLYMYXIN-HYDROCORTISONE (CORTISPORIN) 1 % SOLN otic solution Place 2 drops into the right ear every 8 (eight) hours. 10 mL 0  . predniSONE (DELTASONE) 20 MG tablet One tablet x 5 days or until your skin clears completely then, a half a tablet x 5 days, then half a tablet Monday, Wednesday, Friday, for two months (Patient taking differently: as needed. One tablet x 5 days or until your skin clears completely then, a half a tablet x 5 days, then half a tablet Monday, Wednesday, Friday, for two months) 50 tablet 2   No current facility-administered medications for this visit.     REVIEW OF SYSTEMS:  [X]  denotes positive finding, [ ]  denotes negative finding Cardiac  Comments:  Chest pain or chest pressure:    Shortness of breath upon exertion:    Short of breath when lying flat:    Irregular heart rhythm:        Vascular    Pain in calf, thigh, or hip brought on by ambulation:    Pain in feet at night that wakes you up from your sleep:     Blood clot in your veins:    Leg swelling:         Pulmonary    Oxygen at home:    Productive cough:  Wheezing:         Neurologic    Sudden weakness in arms or legs:     Sudden numbness in arms or legs:     Sudden onset of difficulty speaking or slurred speech:    Temporary loss of vision in one eye:     Problems with dizziness:         Gastrointestinal    Blood in stool:     Vomited blood:         Genitourinary    Burning when urinating:     Blood in urine:        Psychiatric    Major depression:         Hematologic    Bleeding problems:    Problems with blood clotting too easily:        Skin    Rashes or ulcers:        Constitutional    Fever or chills:      PHYSICAL EXAM: Vitals:   03/28/16 0945  BP: 126/80  Pulse: 61  Temp: 98 F (36.7 C)  SpO2: 97%  Weight: 252 lb (114.3 kg)  Height: 6' (1.829 m)    GENERAL: The patient is  a well-nourished male, in no acute distress. The vital signs are documented above. CARDIAC: There is a regular rate and rhythm.  VASCULAR: palpable pedal pulses.  Palpable femoral pulses.  No carotid bruit. PULMONARY: There is good air exchange bilaterally without wheezing or rales. ABDOMEN: Soft and non-tender with normal pitched bowel sounds.  MUSCULOSKELETAL: There are no major deformities or cyanosis. NEUROLOGIC: No focal weakness or paresthesias are detected. SKIN: There are no ulcers or rashes noted. PSYCHIATRIC: The patient has a normal affect.  DATA:  I have reviewed his CT scan with the following findings: Again, the patient is status post endovascular repair of right iliac artery aneurysm with a bifurcated aortoiliac stent graft and embolization of the right hypogastric artery. The aneurysm is decompressed, with no evidence of continued filling/recurrence. No evidence of stent migration.  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 The greatest diameter on the current CT measures 4.6 cm, compared to diameter in 2009 of 3.4 cm.  The posterior proximal margin of the stent graft and cuff terminate in a diseased segment of infrarenal aorta, however, there has been no expansion in aortic diameter at this site.  Celiac artery stenosis.  Ectasias of the left hypogastric artery.  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  Because of the existing device in place as well as the close proximity of the celiac and superior mesenteric artery, I think repair will be complicated.  Of course consideration for four-vessel device may be necessary.  Since there has been no growth in the last year, we will continue to observe this.  I have scheduled to follow up again in 1 year with a 1 mm cuts CT angiogram of the chest abdomen and pelvis.  We discussed treatment indications being 5.5  cm.    Annamarie Major, MD [facility] [employer] [facility] [phone] Pager [phone]

## 2016-04-01 ENCOUNTER — Ambulatory Visit (INDEPENDENT_AMBULATORY_CARE_PROVIDER_SITE_OTHER): Payer: Medicare Other | Admitting: *Deleted

## 2016-04-01 DIAGNOSIS — L304 Erythema intertrigo: Secondary | ICD-10-CM | POA: Diagnosis not present

## 2016-04-01 DIAGNOSIS — L570 Actinic keratosis: Secondary | ICD-10-CM | POA: Diagnosis not present

## 2016-04-01 DIAGNOSIS — Z23 Encounter for immunization: Secondary | ICD-10-CM | POA: Diagnosis not present

## 2016-04-01 DIAGNOSIS — B351 Tinea unguium: Secondary | ICD-10-CM | POA: Diagnosis not present

## 2016-04-01 DIAGNOSIS — L738 Other specified follicular disorders: Secondary | ICD-10-CM | POA: Diagnosis not present

## 2016-04-01 DIAGNOSIS — D1801 Hemangioma of skin and subcutaneous tissue: Secondary | ICD-10-CM | POA: Diagnosis not present

## 2016-04-01 DIAGNOSIS — L400 Psoriasis vulgaris: Secondary | ICD-10-CM | POA: Diagnosis not present

## 2016-04-01 DIAGNOSIS — L821 Other seborrheic keratosis: Secondary | ICD-10-CM | POA: Diagnosis not present

## 2016-04-12 DIAGNOSIS — C61 Malignant neoplasm of prostate: Secondary | ICD-10-CM | POA: Diagnosis not present

## 2016-04-13 ENCOUNTER — Other Ambulatory Visit: Payer: Self-pay

## 2016-04-13 NOTE — Addendum Note (Signed)
Addended by: Lianne Cure A on: 04/13/2016 09:05 AM   Modules accepted: Orders

## 2016-04-20 DIAGNOSIS — E291 Testicular hypofunction: Secondary | ICD-10-CM | POA: Diagnosis not present

## 2016-04-20 DIAGNOSIS — C61 Malignant neoplasm of prostate: Secondary | ICD-10-CM | POA: Diagnosis not present

## 2016-04-26 ENCOUNTER — Ambulatory Visit
Admission: RE | Admit: 2016-04-26 | Discharge: 2016-04-26 | Disposition: A | Discharge status: Home / Self Care | Payer: Medicare Other | Source: Ambulatory Visit | Attending: Radiation Oncology | Admitting: Radiation Oncology

## 2016-04-26 ENCOUNTER — Encounter: Payer: Self-pay | Admitting: *Deleted

## 2016-04-26 DIAGNOSIS — C61 Malignant neoplasm of prostate: Secondary | ICD-10-CM

## 2016-04-26 NOTE — Progress Notes (Signed)
[place] with Mr. Greth he stated he was not aware of his appointment today.  I let Mr. [place] know I was sorry he was not aware of the appointment and the scheduling department would give him a call to reschedule the follow up appointment with Shona Simpson, PA-C.

## 2016-05-04 DIAGNOSIS — B356 Tinea cruris: Secondary | ICD-10-CM | POA: Diagnosis not present

## 2016-05-04 DIAGNOSIS — L309 Dermatitis, unspecified: Secondary | ICD-10-CM | POA: Diagnosis not present

## 2016-05-04 DIAGNOSIS — L282 Other prurigo: Secondary | ICD-10-CM | POA: Diagnosis not present

## 2016-05-04 DIAGNOSIS — L308 Other specified dermatitis: Secondary | ICD-10-CM | POA: Diagnosis not present

## 2016-05-24 DIAGNOSIS — L309 Dermatitis, unspecified: Secondary | ICD-10-CM | POA: Diagnosis not present

## 2016-05-31 DIAGNOSIS — H26491 Other secondary cataract, right eye: Secondary | ICD-10-CM | POA: Diagnosis not present

## 2016-05-31 DIAGNOSIS — Z961 Presence of intraocular lens: Secondary | ICD-10-CM | POA: Diagnosis not present

## 2016-07-13 ENCOUNTER — Inpatient Hospital Stay (HOSPITAL_COMMUNITY): Payer: Medicare Other

## 2016-07-13 ENCOUNTER — Encounter (HOSPITAL_COMMUNITY): Payer: Self-pay

## 2016-07-13 ENCOUNTER — Encounter (HOSPITAL_COMMUNITY): Admission: EM | Disposition: E | Payer: Self-pay | Source: Home / Self Care | Attending: Pulmonary Disease

## 2016-07-13 ENCOUNTER — Inpatient Hospital Stay (HOSPITAL_COMMUNITY)
Admission: EM | Admit: 2016-07-13 | Discharge: 2016-07-21 | DRG: 246 | Disposition: E | Payer: Medicare Other | Attending: Pulmonary Disease | Admitting: Pulmonary Disease

## 2016-07-13 ENCOUNTER — Emergency Department (HOSPITAL_COMMUNITY): Payer: Medicare Other

## 2016-07-13 DIAGNOSIS — G931 Anoxic brain damage, not elsewhere classified: Secondary | ICD-10-CM | POA: Diagnosis present

## 2016-07-13 DIAGNOSIS — Z7952 Long term (current) use of systemic steroids: Secondary | ICD-10-CM | POA: Diagnosis not present

## 2016-07-13 DIAGNOSIS — G253 Myoclonus: Secondary | ICD-10-CM | POA: Diagnosis present

## 2016-07-13 DIAGNOSIS — R739 Hyperglycemia, unspecified: Secondary | ICD-10-CM | POA: Diagnosis present

## 2016-07-13 DIAGNOSIS — I2510 Atherosclerotic heart disease of native coronary artery without angina pectoris: Secondary | ICD-10-CM | POA: Diagnosis not present

## 2016-07-13 DIAGNOSIS — J9601 Acute respiratory failure with hypoxia: Secondary | ICD-10-CM | POA: Diagnosis present

## 2016-07-13 DIAGNOSIS — M1990 Unspecified osteoarthritis, unspecified site: Secondary | ICD-10-CM | POA: Diagnosis present

## 2016-07-13 DIAGNOSIS — Z8546 Personal history of malignant neoplasm of prostate: Secondary | ICD-10-CM | POA: Diagnosis not present

## 2016-07-13 DIAGNOSIS — J9602 Acute respiratory failure with hypercapnia: Secondary | ICD-10-CM | POA: Diagnosis present

## 2016-07-13 DIAGNOSIS — Z4682 Encounter for fitting and adjustment of non-vascular catheter: Secondary | ICD-10-CM | POA: Diagnosis not present

## 2016-07-13 DIAGNOSIS — J690 Pneumonitis due to inhalation of food and vomit: Secondary | ICD-10-CM | POA: Diagnosis present

## 2016-07-13 DIAGNOSIS — Z4659 Encounter for fitting and adjustment of other gastrointestinal appliance and device: Secondary | ICD-10-CM

## 2016-07-13 DIAGNOSIS — I4901 Ventricular fibrillation: Secondary | ICD-10-CM | POA: Diagnosis not present

## 2016-07-13 DIAGNOSIS — I10 Essential (primary) hypertension: Secondary | ICD-10-CM | POA: Diagnosis present

## 2016-07-13 DIAGNOSIS — Z7982 Long term (current) use of aspirin: Secondary | ICD-10-CM | POA: Diagnosis not present

## 2016-07-13 DIAGNOSIS — I213 ST elevation (STEMI) myocardial infarction of unspecified site: Principal | ICD-10-CM

## 2016-07-13 DIAGNOSIS — N179 Acute kidney failure, unspecified: Secondary | ICD-10-CM | POA: Diagnosis present

## 2016-07-13 DIAGNOSIS — R570 Cardiogenic shock: Secondary | ICD-10-CM | POA: Diagnosis present

## 2016-07-13 DIAGNOSIS — E785 Hyperlipidemia, unspecified: Secondary | ICD-10-CM | POA: Diagnosis present

## 2016-07-13 DIAGNOSIS — Z66 Do not resuscitate: Secondary | ICD-10-CM | POA: Diagnosis present

## 2016-07-13 DIAGNOSIS — I2102 ST elevation (STEMI) myocardial infarction involving left anterior descending coronary artery: Secondary | ICD-10-CM | POA: Diagnosis not present

## 2016-07-13 DIAGNOSIS — Z79899 Other long term (current) drug therapy: Secondary | ICD-10-CM

## 2016-07-13 DIAGNOSIS — Z8249 Family history of ischemic heart disease and other diseases of the circulatory system: Secondary | ICD-10-CM | POA: Diagnosis not present

## 2016-07-13 DIAGNOSIS — E872 Acidosis: Secondary | ICD-10-CM | POA: Diagnosis present

## 2016-07-13 DIAGNOSIS — Z87891 Personal history of nicotine dependence: Secondary | ICD-10-CM | POA: Diagnosis not present

## 2016-07-13 DIAGNOSIS — R740 Nonspecific elevation of levels of transaminase and lactic acid dehydrogenase [LDH]: Secondary | ICD-10-CM | POA: Diagnosis present

## 2016-07-13 DIAGNOSIS — K7200 Acute and subacute hepatic failure without coma: Secondary | ICD-10-CM | POA: Diagnosis not present

## 2016-07-13 DIAGNOSIS — I469 Cardiac arrest, cause unspecified: Secondary | ICD-10-CM

## 2016-07-13 HISTORY — PX: LEFT HEART CATH AND CORONARY ANGIOGRAPHY: CATH118249

## 2016-07-13 HISTORY — PX: CORONARY STENT INTERVENTION: CATH118234

## 2016-07-13 LAB — CBC
HEMATOCRIT: 47.6 % (ref 39.0–52.0)
Hemoglobin: 15.9 g/dL (ref 13.0–17.0)
MCH: 34 pg (ref 26.0–34.0)
MCHC: 33.4 g/dL (ref 30.0–36.0)
MCV: 101.9 fL — ABNORMAL HIGH (ref 78.0–100.0)
Platelets: 122 10*3/uL — ABNORMAL LOW (ref 150–400)
RBC: 4.67 MIL/uL (ref 4.22–5.81)
RDW: 14.4 % (ref 11.5–15.5)
WBC: 11.6 10*3/uL — AB (ref 4.0–10.5)

## 2016-07-13 LAB — MAGNESIUM: Magnesium: 2.2 mg/dL (ref 1.7–2.4)

## 2016-07-13 LAB — GLUCOSE, CAPILLARY
GLUCOSE-CAPILLARY: 290 mg/dL — AB (ref 65–99)
GLUCOSE-CAPILLARY: 120 mg/dL — AB (ref 65–99)

## 2016-07-13 LAB — I-STAT TROPONIN, ED: Troponin i, poc: 0.44 ng/mL (ref 0.00–0.08)

## 2016-07-13 LAB — POCT I-STAT 3, ART BLOOD GAS (G3+)
ACID-BASE DEFICIT: 14 mmol/L — AB (ref 0.0–2.0)
Acid-base deficit: 16 mmol/L — ABNORMAL HIGH (ref 0.0–2.0)
BICARBONATE: 14.9 mmol/L — AB (ref 20.0–28.0)
Bicarbonate: 15 mmol/L — ABNORMAL LOW (ref 20.0–28.0)
O2 Saturation: 84 %
O2 Saturation: 97 %
PCO2 ART: 54.2 mmHg — AB (ref 32.0–48.0)
PH ART: 7.176 — AB (ref 7.350–7.450)
TCO2: 17 mmol/L (ref 0–100)
TCO2: 16 mmol/L (ref 0–100)
pCO2 arterial: 39 mmHg (ref 32.0–48.0)
pH, Arterial: 7.05 — CL (ref 7.350–7.450)
pO2, Arterial: 70 mmHg — ABNORMAL LOW (ref 83.0–108.0)
pO2, Arterial: 100 mmHg (ref 83.0–108.0)

## 2016-07-13 LAB — DIFFERENTIAL
BASOS ABS: 0.1 10*3/uL (ref 0.0–0.1)
Basophils Relative: 1 %
EOS ABS: 0.3 10*3/uL (ref 0.0–0.7)
Eosinophils Relative: 3 %
LYMPHS ABS: 5 10*3/uL — AB (ref 0.7–4.0)
Lymphocytes Relative: 43 %
MONO ABS: 0.2 10*3/uL (ref 0.1–1.0)
MONOS PCT: 2 %
NEUTROS ABS: 6 10*3/uL (ref 1.7–7.7)
NEUTROS PCT: 51 %
WBC Morphology: INCREASED

## 2016-07-13 LAB — POCT ACTIVATED CLOTTING TIME: ACTIVATED CLOTTING TIME: 687 s

## 2016-07-13 LAB — COMPREHENSIVE METABOLIC PANEL
ALBUMIN: 3.3 g/dL — AB (ref 3.5–5.0)
ALK PHOS: 82 U/L (ref 38–126)
ALT: 420 U/L — ABNORMAL HIGH (ref 17–63)
ANION GAP: 22 — AB (ref 5–15)
AST: 374 U/L — ABNORMAL HIGH (ref 15–41)
BILIRUBIN TOTAL: 0.9 mg/dL (ref 0.3–1.2)
BUN: 17 mg/dL (ref 6–20)
CALCIUM: 8.3 mg/dL — AB (ref 8.9–10.3)
CO2: 13 mmol/L — ABNORMAL LOW (ref 22–32)
Chloride: 103 mmol/L (ref 101–111)
Creatinine, Ser: 1.31 mg/dL — ABNORMAL HIGH (ref 0.61–1.24)
GFR, EST NON AFRICAN AMERICAN: 52 mL/min — AB (ref >60)
GLUCOSE: 423 mg/dL — AB (ref 65–99)
POTASSIUM: 3.9 mmol/L (ref 3.5–5.1)
Sodium: 138 mmol/L (ref 135–145)
TOTAL PROTEIN: 5.8 g/dL — AB (ref 6.5–8.1)

## 2016-07-13 LAB — BASIC METABOLIC PANEL
ANION GAP: 16 — AB (ref 5–15)
BUN: 22 mg/dL — ABNORMAL HIGH (ref 6–20)
CHLORIDE: 110 mmol/L (ref 101–111)
CO2: 12 mmol/L — ABNORMAL LOW (ref 22–32)
Calcium: 7.6 mg/dL — ABNORMAL LOW (ref 8.9–10.3)
Creatinine, Ser: 1.9 mg/dL — ABNORMAL HIGH (ref 0.61–1.24)
GFR, EST AFRICAN AMERICAN: 38 mL/min — AB (ref >60)
GFR, EST NON AFRICAN AMERICAN: 33 mL/min — AB (ref >60)
Glucose, Bld: 381 mg/dL — ABNORMAL HIGH (ref 65–99)
POTASSIUM: 4 mmol/L (ref 3.5–5.1)
SODIUM: 138 mmol/L (ref 135–145)

## 2016-07-13 LAB — PHOSPHORUS: PHOSPHORUS: 6.7 mg/dL — AB (ref 2.5–4.6)

## 2016-07-13 LAB — TROPONIN I
TROPONIN I: 0.4 ng/mL — AB (ref <0.03)
TROPONIN I: 14.59 ng/mL — AB (ref <0.03)

## 2016-07-13 LAB — LIPID PANEL
CHOL/HDL RATIO: 5.4 ratio
CHOLESTEROL: 232 mg/dL — AB (ref 0–200)
HDL: 43 mg/dL (ref >40)
LDL Cholesterol: 114 mg/dL — ABNORMAL HIGH (ref 0–99)
TRIGLYCERIDES: 377 mg/dL — AB (ref <150)
VLDL: 75 mg/dL — AB (ref 0–40)

## 2016-07-13 LAB — LACTIC ACID, PLASMA: Lactic Acid, Venous: 8.1 mmol/L (ref 0.5–1.9)

## 2016-07-13 LAB — MRSA PCR SCREENING: MRSA BY PCR: NEGATIVE

## 2016-07-13 LAB — APTT: aPTT: 47 seconds — ABNORMAL HIGH (ref 24–36)

## 2016-07-13 LAB — PROTIME-INR
INR: 1.39
PROTHROMBIN TIME: 17.2 s — AB (ref 11.4–15.2)

## 2016-07-13 LAB — CORTISOL: Cortisol, Plasma: 19.9 ug/dL

## 2016-07-13 LAB — I-STAT CG4 LACTIC ACID, ED: Lactic Acid, Venous: 10.2 mmol/L (ref 0.5–1.9)

## 2016-07-13 SURGERY — LEFT HEART CATH AND CORONARY ANGIOGRAPHY
Anesthesia: LOCAL

## 2016-07-13 MED ORDER — SODIUM CHLORIDE 0.9 % IV SOLN
INTRAVENOUS | Status: DC | PRN
  Administered 2016-07-13: 0.75 ug/kg/min via INTRAVENOUS

## 2016-07-13 MED ORDER — SODIUM CHLORIDE 0.9 % IV SOLN
1.0000 g | Freq: Once | INTRAVENOUS | Status: AC
  Administered 2016-07-13: 1 g via INTRAVENOUS
  Filled 2016-07-13: qty 10

## 2016-07-13 MED ORDER — NOREPINEPHRINE BITARTRATE 1 MG/ML IV SOLN
INTRAVENOUS | Status: AC
  Filled 2016-07-13: qty 4

## 2016-07-13 MED ORDER — TICAGRELOR 90 MG PO TABS
180.0000 mg | ORAL_TABLET | Freq: Once | ORAL | Status: AC
  Administered 2016-07-13: 180 mg via ORAL
  Filled 2016-07-13: qty 2

## 2016-07-13 MED ORDER — LORAZEPAM 2 MG/ML IJ SOLN
2.0000 mg | INTRAMUSCULAR | Status: DC
  Administered 2016-07-13: 2 mg via INTRAVENOUS
  Filled 2016-07-13: qty 1

## 2016-07-13 MED ORDER — LEVETIRACETAM 500 MG/5ML IV SOLN
500.0000 mg | Freq: Two times a day (BID) | INTRAVENOUS | Status: DC
Start: 2016-07-13 — End: 2016-07-13
  Administered 2016-07-13: 500 mg via INTRAVENOUS
  Filled 2016-07-13 (×2): qty 5

## 2016-07-13 MED ORDER — IOPAMIDOL (ISOVUE-370) INJECTION 76%
INTRAVENOUS | Status: DC | PRN
  Administered 2016-07-13: 185 mL via INTRA_ARTERIAL

## 2016-07-13 MED ORDER — SODIUM CHLORIDE 0.9 % IV SOLN
INTRAVENOUS | Status: DC
  Administered 2016-07-13: 08:00:00 via INTRAVENOUS

## 2016-07-13 MED ORDER — PANTOPRAZOLE SODIUM 40 MG IV SOLR: 40.0000 mg | Freq: Every day | INTRAVENOUS | Status: DC

## 2016-07-13 MED ORDER — VALPROATE SODIUM 500 MG/5ML IV SOLN
500.0000 mg | Freq: Two times a day (BID) | INTRAVENOUS | Status: DC
  Administered 2016-07-13: 500 mg via INTRAVENOUS
  Filled 2016-07-13 (×2): qty 5

## 2016-07-13 MED ORDER — MIDAZOLAM HCL 2 MG/2ML IJ SOLN: 1.0000 mg | INTRAMUSCULAR | Status: DC | PRN

## 2016-07-13 MED ORDER — SODIUM CHLORIDE 0.9 % IV SOLN: 250.0000 mL | INTRAVENOUS | Status: DC | PRN

## 2016-07-13 MED ORDER — VERAPAMIL HCL 2.5 MG/ML IV SOLN
INTRAVENOUS | Status: AC
  Filled 2016-07-13: qty 2

## 2016-07-13 MED ORDER — HEPARIN SODIUM (PORCINE) 1000 UNIT/ML IJ SOLN
INTRAMUSCULAR | Status: AC
  Filled 2016-07-13: qty 1

## 2016-07-13 MED ORDER — FUROSEMIDE 10 MG/ML IJ SOLN
10.0000 mg/h | INTRAMUSCULAR | Status: DC
Start: 2016-07-13 — End: 2016-07-13
  Administered 2016-07-13: 10 mg/h via INTRAVENOUS
  Filled 2016-07-13: qty 25

## 2016-07-13 MED ORDER — INSULIN ASPART 100 UNIT/ML ~~LOC~~ SOLN
0.0000 [IU] | SUBCUTANEOUS | Status: DC
  Administered 2016-07-13: 8 [IU] via SUBCUTANEOUS

## 2016-07-13 MED ORDER — SODIUM CHLORIDE 0.9 % IV SOLN: INTRAVENOUS | Status: DC

## 2016-07-13 MED ORDER — SODIUM CHLORIDE 0.9 % IV SOLN
INTRAVENOUS | Status: DC | PRN
  Administered 2016-07-13: 1.75 mg/kg/h via INTRAVENOUS

## 2016-07-13 MED ORDER — ATORVASTATIN CALCIUM 20 MG PO TABS: 20.0000 mg | ORAL_TABLET | Freq: Every day | ORAL | Status: DC

## 2016-07-13 MED ORDER — ROCURONIUM BROMIDE 50 MG/5ML IV SOLN
INTRAVENOUS | Status: DC | PRN
  Administered 2016-07-13: 100 mg via INTRAVENOUS

## 2016-07-13 MED ORDER — ACETAMINOPHEN 325 MG PO TABS: 650.0000 mg | ORAL_TABLET | ORAL | Status: DC | PRN

## 2016-07-13 MED ORDER — MIDAZOLAM HCL 5 MG/ML IJ SOLN
0.5000 mg/h | INTRAMUSCULAR | Status: DC
  Administered 2016-07-13: 0.5 mg/h via INTRAVENOUS
  Filled 2016-07-13: qty 10

## 2016-07-13 MED ORDER — BIVALIRUDIN 250 MG IV SOLR
INTRAVENOUS | Status: AC
  Filled 2016-07-13: qty 250

## 2016-07-13 MED ORDER — NOREPINEPHRINE BITARTRATE 1 MG/ML IV SOLN
0.0000 ug/min | INTRAVENOUS | Status: DC
  Administered 2016-07-13: 30 ug/min via INTRAVENOUS
  Filled 2016-07-13 (×3): qty 4

## 2016-07-13 MED ORDER — IOPAMIDOL (ISOVUE-370) INJECTION 76%
INTRAVENOUS | Status: AC
  Filled 2016-07-13: qty 100

## 2016-07-13 MED ORDER — CANGRELOR TETRASODIUM 50 MG IV SOLR
INTRAVENOUS | Status: AC
  Filled 2016-07-13: qty 50

## 2016-07-13 MED ORDER — AMIODARONE HCL IN DEXTROSE 360-4.14 MG/200ML-% IV SOLN
60.0000 mg/h | INTRAVENOUS | Status: AC
  Administered 2016-07-13: 60 mg/h via INTRAVENOUS

## 2016-07-13 MED ORDER — SODIUM BICARBONATE 8.4 % IV SOLN
INTRAVENOUS | Status: DC
  Administered 2016-07-13: 09:00:00 via INTRAVENOUS
  Filled 2016-07-13 (×2): qty 100

## 2016-07-13 MED ORDER — CHLORHEXIDINE GLUCONATE 0.12% ORAL RINSE (MEDLINE KIT)
15.0000 mL | Freq: Two times a day (BID) | OROMUCOSAL | Status: DC
  Administered 2016-07-13: 15 mL via OROMUCOSAL

## 2016-07-13 MED ORDER — SODIUM CHLORIDE 0.9 % IV SOLN
4.0000 ug/kg/min | INTRAVENOUS | Status: DC
  Administered 2016-07-13: 4 ug/kg/min via INTRAVENOUS
  Filled 2016-07-13: qty 50

## 2016-07-13 MED ORDER — ASPIRIN 81 MG PO CHEW: 324.0000 mg | CHEWABLE_TABLET | Freq: Once | ORAL | Status: DC

## 2016-07-13 MED ORDER — TICAGRELOR 90 MG PO TABS
90.0000 mg | ORAL_TABLET | Freq: Two times a day (BID) | ORAL | Status: DC
  Filled 2016-07-13: qty 1

## 2016-07-13 MED ORDER — HEPARIN SODIUM (PORCINE) 5000 UNIT/ML IJ SOLN
INTRAMUSCULAR | Status: AC
  Filled 2016-07-13: qty 1

## 2016-07-13 MED ORDER — ASPIRIN 81 MG PO CHEW: 81.0000 mg | CHEWABLE_TABLET | Freq: Every day | ORAL | Status: DC

## 2016-07-13 MED ORDER — SODIUM CHLORIDE 0.9 % IV SOLN
INTRAVENOUS | Status: DC
  Administered 2016-07-13: 05:00:00 via INTRAVENOUS

## 2016-07-13 MED ORDER — BIVALIRUDIN BOLUS VIA INFUSION - CUPID
INTRAVENOUS | Status: DC | PRN
  Administered 2016-07-13: 85.5 mg via INTRAVENOUS

## 2016-07-13 MED ORDER — AMIODARONE HCL IN DEXTROSE 360-4.14 MG/200ML-% IV SOLN
INTRAVENOUS | Status: AC
  Filled 2016-07-13: qty 200

## 2016-07-13 MED ORDER — ONDANSETRON HCL 4 MG/2ML IJ SOLN: 4.0000 mg | Freq: Four times a day (QID) | INTRAMUSCULAR | Status: DC | PRN

## 2016-07-13 MED ORDER — SODIUM CHLORIDE 0.9% FLUSH: 3.0000 mL | Freq: Two times a day (BID) | INTRAVENOUS | Status: DC

## 2016-07-13 MED ORDER — NOREPINEPHRINE BITARTRATE 1 MG/ML IV SOLN
INTRAVENOUS | Status: DC | PRN
  Administered 2016-07-13: 5 ug/kg/min via INTRAVENOUS

## 2016-07-13 MED ORDER — AMIODARONE HCL IN DEXTROSE 360-4.14 MG/200ML-% IV SOLN
30.0000 mg/h | INTRAVENOUS | Status: DC
  Administered 2016-07-13: 30 mg/h via INTRAVENOUS
  Filled 2016-07-13: qty 200

## 2016-07-13 MED ORDER — CANGRELOR BOLUS VIA INFUSION
INTRAVENOUS | Status: DC | PRN
  Administered 2016-07-13: 3420 ug via INTRAVENOUS

## 2016-07-13 MED ORDER — LIDOCAINE HCL (PF) 1 % IJ SOLN
INTRAMUSCULAR | Status: AC
  Filled 2016-07-13: qty 30

## 2016-07-13 MED ORDER — ASPIRIN 300 MG RE SUPP
300.0000 mg | RECTAL | Status: AC
  Administered 2016-07-13: 300 mg via RECTAL
  Filled 2016-07-13: qty 1

## 2016-07-13 MED ORDER — ORAL CARE MOUTH RINSE
15.0000 mL | OROMUCOSAL | Status: DC
  Administered 2016-07-13 (×2): 15 mL via OROMUCOSAL

## 2016-07-13 MED ORDER — IOPAMIDOL (ISOVUE-370) INJECTION 76%
INTRAVENOUS | Status: AC
  Filled 2016-07-13: qty 125

## 2016-07-13 MED ORDER — HEPARIN (PORCINE) IN NACL 2-0.9 UNIT/ML-% IJ SOLN
INTRAMUSCULAR | Status: AC
  Filled 2016-07-13: qty 1000

## 2016-07-13 MED ORDER — LIDOCAINE HCL (PF) 1 % IJ SOLN
INTRAMUSCULAR | Status: DC | PRN
Start: 2016-07-13 — End: 2016-07-13
  Administered 2016-07-13: 2 mL via SUBCUTANEOUS

## 2016-07-13 MED ORDER — FENTANYL CITRATE (PF) 100 MCG/2ML IJ SOLN: 50.0000 ug | INTRAMUSCULAR | Status: DC | PRN

## 2016-07-13 MED ORDER — ETOMIDATE 2 MG/ML IV SOLN
INTRAVENOUS | Status: DC | PRN
  Administered 2016-07-13: 20 mg via INTRAVENOUS

## 2016-07-13 MED ORDER — HEPARIN SODIUM (PORCINE) 5000 UNIT/ML IJ SOLN
4000.0000 [IU] | INTRAMUSCULAR | Status: AC
  Administered 2016-07-13: 4000 [IU] via INTRAVENOUS

## 2016-07-13 MED ORDER — SODIUM CHLORIDE 0.9 % IV SOLN
3.0000 g | Freq: Three times a day (TID) | INTRAVENOUS | Status: DC
  Administered 2016-07-13: 3 g via INTRAVENOUS
  Filled 2016-07-13 (×3): qty 3

## 2016-07-13 MED ORDER — HEPARIN (PORCINE) IN NACL 2-0.9 UNIT/ML-% IJ SOLN
INTRAMUSCULAR | Status: DC | PRN
  Administered 2016-07-13: 1500 mL

## 2016-07-13 MED ORDER — HEPARIN SODIUM (PORCINE) 5000 UNIT/ML IJ SOLN: 5000.0000 [IU] | Freq: Three times a day (TID) | INTRAMUSCULAR | Status: DC

## 2016-07-13 MED ORDER — SODIUM CHLORIDE 0.9% FLUSH: 3.0000 mL | INTRAVENOUS | Status: DC | PRN

## 2016-07-13 MED ORDER — SODIUM CHLORIDE 0.9 % IV SOLN
10.0000 mL/h | INTRAVENOUS | Status: DC
  Administered 2016-07-13: 20 mL/h via INTRAVENOUS

## 2016-07-13 MED ORDER — SODIUM CHLORIDE 0.9 % IV SOLN: INTRAVENOUS | Status: DC | PRN

## 2016-07-13 MED ORDER — SODIUM BICARBONATE 8.4 % IV SOLN
100.0000 meq | Freq: Once | INTRAVENOUS | Status: AC
  Administered 2016-07-13: 100 meq via INTRAVENOUS
  Filled 2016-07-13: qty 50

## 2016-07-13 SURGICAL SUPPLY — 22 items
BALLN EUPHORA RX 2.5X15 (BALLOONS) ×2
BALLN ~~LOC~~ EUPHORA RX 3.25X20 (BALLOONS) ×2
BALLOON EUPHORA RX 2.5X15 (BALLOONS) ×1 IMPLANT
BALLOON ~~LOC~~ EUPHORA RX 3.25X20 (BALLOONS) ×1 IMPLANT
CATH INFINITI 5FR MULTPACK ANG (CATHETERS) ×2 IMPLANT
CATH VISTA GUIDE 6FR XBLAD3.5 (CATHETERS) ×2 IMPLANT
DEVICE RAD COMP TR BAND LRG (VASCULAR PRODUCTS) ×2 IMPLANT
GLIDESHEATH SLEND SS 6F .021 (SHEATH) ×2 IMPLANT
GUIDEWIRE INQWIRE 1.5J.035X260 (WIRE) ×1 IMPLANT
INQWIRE 1.5J .035X260CM (WIRE) ×2
KIT ENCORE 26 ADVANTAGE (KITS) ×4 IMPLANT
KIT ESSENTIALS PG (KITS) ×2 IMPLANT
KIT HEART LEFT (KITS) ×2 IMPLANT
PACK CARDIAC CATHETERIZATION (CUSTOM PROCEDURE TRAY) ×2 IMPLANT
SHEATH PINNACLE 6F 10CM (SHEATH) ×2 IMPLANT
STENT SYNERGY DES 3X32 (Permanent Stent) ×2 IMPLANT
SYR MEDRAD MARK V 150ML (SYRINGE) ×2 IMPLANT
TRANSDUCER W/STOPCOCK (MISCELLANEOUS) ×2 IMPLANT
TUBING CIL FLEX 10 FLL-RA (TUBING) ×2 IMPLANT
WIRE COUGAR XT STRL 190CM (WIRE) ×2 IMPLANT
WIRE EMERALD 3MM-J .035X150CM (WIRE) ×2 IMPLANT
WIRE HI TORQ WHISPER MS 190CM (WIRE) ×2 IMPLANT

## 2016-07-14 ENCOUNTER — Encounter (HOSPITAL_COMMUNITY): Payer: Self-pay | Admitting: Cardiovascular Disease

## 2016-07-14 ENCOUNTER — Telehealth: Payer: Self-pay

## 2016-07-14 LAB — HEMOGLOBIN A1C
Hgb A1c MFr Bld: 6 % — ABNORMAL HIGH (ref 4.8–5.6)
MEAN PLASMA GLUCOSE: 126

## 2016-07-14 MED FILL — Heparin Sodium (Porcine) Inj 1000 Unit/ML: INTRAMUSCULAR | Qty: 10 | Status: AC

## 2016-07-14 MED FILL — Verapamil HCl IV Soln 2.5 MG/ML: INTRAVENOUS | Qty: 2 | Status: AC

## 2016-07-14 NOTE — Telephone Encounter (Signed)
On 07/14/16 I received a death certificate from [place] (original). The death certificate is for cremation. The patient is a patient of Doctor Titus Mould. The death certificate will be taken to [facility]) this pm for signature.  On 07/30/16 I received the death certificate back from Doctor Titus Mould. I got the death certificate ready and called the funeral home to let them know the death certificate is ready for pickup.

## 2016-07-15 ENCOUNTER — Telehealth: Payer: Self-pay | Admitting: Medical Oncology

## 2016-07-15 NOTE — Progress Notes (Signed)
I called the [place] family to give my condolences. I spoke with his daughter to tell her how much I enjoyed her father while he was coming for radiation. She states he loved the staff and it made his treatments easy. She states he had a MI that was sudden and he did not suffer. I asked them to call me if I can do anything for them. They thanked me for calling.

## 2016-07-18 LAB — CULTURE, BLOOD (ROUTINE X 2): CULTURE: NO GROWTH

## 2016-07-21 NOTE — ED Provider Notes (Signed)
[place] DEPT Provider Note   CSN: [id number] Arrival date & time: 18-Jul-2016  [id number]     History   Chief Complaint Chief Complaint  Patient presents with  . Post CPR    HPI Adrian Reynolds is a 76 y.o. male.  HPI  This is a 76 year old male who presents status post V. fib arrest. Per EMS report, they were called out at approximately 3:15 when the wife noted that the patient was agonal. He was noted to be in V. fib upon their arrival. He was initially shocked at 200 J with return to sinus rhythm. However, he had multiple recurrences of V. fib/V. tach. He received a total of 5 epinephrine and is now on an epinephrine drip. He also received 150 of amiodarone. He is currently in sinus rhythm.  Collateral information from the family provided. Patient spent the last 2 days out in [place] skiing. They returned home this evening at approximately 12:30 AM. He unpacked the car and sat down to watch TV. At some time between 12:30 and 3 AM, his wife saw him and ask him to come to bed. He stated that he would be there shortly. She woke up at 3:15 and noted that something was "funny." She is unsure when he went to bed.  No known cardiac history. Does have a history of AAA repair 25 years ago and recent history of prostate cancer.  Past Medical History:  Diagnosis Date  . AAA (abdominal aortic aneurysm) ([place])   . Arthritis    left hip  . Hyperlipidemia   . Hypertension   . 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 Prostate cancer [facility])     Patient Active Problem List   Diagnosis Date Noted  . Malignant neoplasm of prostate ([place]) 10/02/2015  . Routine general medical examination at a health care facility 08/24/2015  . Elevated blood sugar 04/07/2015  . AAA (abdominal aortic aneurysm) without rupture ([place]) 03/24/2015  . Bradycardia 11/12/2014  . Abdominal aneurysm without mention of rupture 02/18/2014  . 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 Eczema of hand 09/29/2010  . Dysphasia  09/29/2010  . Hyperlipidemia 08/15/2007  . Gout 08/15/2007  . ABDOMINAL AORTIC ANEURYSM REPAIR, HX OF 08/15/2007  . Essential hypertension 12/08/2006    Past Surgical History:  Procedure Laterality Date  . ABDOMINAL AORTIC ANEURYSM REPAIR    . HERNIA REPAIR     Mesh ventral  . PROSTATE BIOPSY         Home Medications    Prior to Admission medications   Medication Sig Start Date End Date Taking? Authorizing Provider  allopurinol (ZYLOPRIM) 300 MG tablet Take 1 tablet (300 mg total) by mouth daily. 08/24/15   Dorena Cookey, MD  aspirin 81 MG tablet Take 81 mg by mouth daily.      Historical Provider, MD  atorvastatin (LIPITOR) 20 MG tablet Take 1 tablet (20 mg total) by mouth at bedtime. 08/24/15   Dorena Cookey, MD  lisinopril-hydrochlorothiazide (PRINZIDE,ZESTORETIC) 20-25 MG tablet TAKE ONE TABLET EACH DAY 08/24/15   Dorena Cookey, MD  NEOMYCIN-POLYMYXIN-HYDROCORTISONE (CORTISPORIN) 1 % SOLN otic solution Place 2 drops into the right ear every 8 (eight) hours. 08/25/15   Dorena Cookey, MD  predniSONE (DELTASONE) 20 MG tablet One tablet x 5 days or until your skin clears completely then, a half a tablet x 5 days, then half a tablet Monday, Wednesday, Friday, for two months Patient taking differently: as needed. One tablet x 5 days or  until your skin clears completely then, a half a tablet x 5 days, then half a tablet Monday, Wednesday, Friday, for two months 05/28/14   Dorena Cookey, MD    Family History Family History  Problem Relation Age of Onset  . Diabetes Father   . Stroke Father   . Coronary artery disease Father   . Cancer Neg Hx     Social History Social History  Substance Use Topics  . Smoking status: Former Smoker    Types: Cigarettes    Quit date: 02/14/1991  . Smokeless tobacco: Never Used  . Alcohol use 0.0 oz/week     Comment: occ     Allergies   Patient has no known allergies.   Review of Systems Review of Systems  Unable to perform ROS: Acuity of  condition     Physical Exam Updated Vital Signs BP 125/84   Pulse (!) 122   Temp (!) 96.2 F (35.7 C) (Rectal)   Resp 20   SpO2 (!) 82%   Physical Exam  Constitutional:  Agonal with aching airway in place, minimally responsive, no purposeful movement  HENT:  Head: Normocephalic and atraumatic.  Emesis noted around the mouth  Eyes:  Pupils 3 mm and reactive bilaterally  Cardiovascular:  No murmur heard. Irregular rate  Pulmonary/Chest: He has no wheezes. He has no rales.  Agonal with shallow respirations  Abdominal:  Abdomen distended  Musculoskeletal:  Trace lower extremity edema  Neurological: GCS eye subscore is 1. GCS verbal subscore is 1. GCS motor subscore is 1.  Skin:  Mottled and pale appearing  Nursing note and vitals reviewed.    ED Treatments / Results  Labs (all labs ordered are listed, but only abnormal results are displayed) Labs Reviewed  I-STAT TROPOININ, ED - Abnormal; Notable for the following:       Result Value   Troponin i, poc 0.44 (*)    All other components within normal limits  I-STAT CG4 LACTIC ACID, ED - Abnormal; Notable for the following:    Lactic Acid, Venous 10.20 (*)    All other components within normal limits  CBC  DIFFERENTIAL  PROTIME-INR  APTT  COMPREHENSIVE METABOLIC PANEL  TROPONIN I  LIPID PANEL  BLOOD GAS, ARTERIAL  CBG MONITORING, ED    EKG  EKG Interpretation  Date/Time:  2016-07-17 04:22:42 EST Ventricular Rate:  90 PR Interval:    QRS Duration: 102 QT Interval:  365 QTC Calculation: 447 R Axis:   49 Text Interpretation:  Sinus rhythm Probable left atrial enlargement Probable anterolateral infarct, acute Baseline wander in lead(s) III aVL Confirmed by Dina Rich  MD, Yen Wandell ([id number]) on 07-17-16 4:30:12 AM       Radiology Dg Chest Port 1 View  Result Date: 2016/07/17 CLINICAL DATA:  76 year old male status post intubation. Evaluate for tube placement. EXAM: PORTABLE CHEST 1 VIEW  COMPARISON:  Chest radiograph dated 11/30/2006 FINDINGS: An endotracheal tube is noted with tip approximately 5 cm above the carina. There is stable moderate cardiomegaly. There is crowding of central vasculature with mild congestive changes. No edema. There is no focal consolidation, pleural effusion, or pneumothorax. The left costophrenic angle has been excluded from the image. Apparent focal discontinuity of the anterior right fifth and sixth ribs noted, likely artifactual. Nondisplaced rib fractures are not excluded. Clinical correlation is recommended. IMPRESSION: 1. Endotracheal tube above the carina. 2. Stable moderate cardiomegaly with mild vascular congestion. 3. Artifact versus less likely nondisplaced fractures of the anterior  right fifth and sixth ribs. Clinical correlation is recommended. Electronically Signed   By: Anner Crete M.D.   On: 08/11/2016 05:06    Procedures Procedures (including critical care time)  CRITICAL CARE Performed by: Merryl Hacker   Total critical care time: 60 minutes  Critical care time was exclusive of separately billable procedures and treating other patients.  Critical care was necessary to treat or prevent imminent or life-threatening deterioration.  Critical care was time spent personally by me on the following activities: development of treatment plan with patient and/or surrogate as well as nursing, discussions with consultants, evaluation of patient's response to treatment, examination of patient, obtaining history from patient or surrogate, ordering and performing treatments and interventions, ordering and review of laboratory studies, ordering and review of radiographic studies, pulse oximetry and re-evaluation of patient's condition.  INTUBATION Performed by: Merryl Hacker  Required items: required blood products, implants, devices, and special equipment available Patient identity confirmed: provided demographic data and  hospital-assigned identification number Time out: Immediately prior to procedure a "time out" was called to verify the correct patient, procedure, equipment, support staff and site/side marked as required.  Indications: airway protection  Intubation method: Glidescope Laryngoscopy   Preoxygenation: BVM  Sedatives: Etomidate Paralytic: Rocuronium  Tube Size: 8-0 cuffed  Post-procedure assessment: chest rise and ETCO2 monitor Breath sounds: equal and absent over the epigastrium Tube secured with: ETT holder Chest x-ray interpreted by radiologist and me.  Chest x-ray findings: endotracheal tube in appropriate position  Patient tolerated the procedure well with no immediate complications.   Medications Ordered in ED Medications  etomidate (AMIDATE) injection ( Intravenous MAR Hold 2016-08-11 0515)  rocuronium (ZEMURON) injection ( Intravenous MAR Hold August 11, 2016 0515)  0.9 %  sodium chloride infusion (20 mL/hr Intravenous New Bag/Given 08/11/2016 0456)  aspirin chewable tablet 324 mg ( Oral MAR Hold 11-Aug-2016 0515)  0.9 %  sodium chloride infusion ( Intravenous New Bag/Given 08-11-2016 0456)  amiodarone (NEXTERONE PREMIX) 360-4.14 MG/200ML-% (1.8 mg/mL) IV infusion (60 mg/hr Intravenous New Bag/Given August 11, 2016 0501)    Followed by  amiodarone (NEXTERONE PREMIX) 360-4.14 MG/200ML-% (1.8 mg/mL) IV infusion (not administered)  0.9 %  sodium chloride infusion ( Intra-arterial MAR Hold 2016-08-11 0515)  heparin injection 4,000 Units (4,000 Units Intravenous Given August 11, 2016 0437)  aspirin suppository 300 mg (300 mg Rectal Given 2016-08-11 0440)     Initial Impression / Assessment and Plan / ED Course  I have reviewed the triage vital signs and the nursing notes.  Pertinent labs & imaging results that were available during my care of the patient were reviewed by me and considered in my medical decision making (see chart for details).     Patient presents status post arrest. V. fib arrest. Initial EKG  with some anterior Q waves and ST elevation. Code STEMI called. He was intubated. Dr. Angelena Form  to take patient to the Cath Lab. Critical care was also consulted. Cooling protocol in place. Patient was given heparin bolus. Bedside ultrasound without evidence of RV strain as PE also a consideration. Family was updated.    Final Clinical Impressions(s) / ED Diagnoses   Final diagnoses:  ST elevation myocardial infarction (STEMI), unspecified artery ([place])  Cardiac arrest [facility])  Ventricular fibrillation [facility])    New Prescriptions Current Discharge Medication List       Merryl Hacker, MD August 11, 2016 [phone]

## 2016-07-21 NOTE — Progress Notes (Signed)
ANTIBIOTIC CONSULT NOTE - INITIAL  Pharmacy Consult for Unasyn Indication: r/o aspiration PNA  No Known Allergies  Patient Measurements:   Adjusted Body Weight:    Vital Signs: Temp: 96.2 F (35.7 C) (02/21 0444) Temp Source: Rectal (02/21 0444) BP: 75/49 (02/21 0640) Pulse Rate: 0 (02/21 0645) Intake/Output from previous day: No intake/output data recorded. Intake/Output from this shift: No intake/output data recorded.  Labs:  Recent Labs  2016-08-10 0432  WBC 11.6*  HGB 15.9  PLT 122*  CREATININE 1.31*   CrCl cannot be calculated (Unknown ideal weight.). No results for input(s): VANCOTROUGH, VANCOPEAK, VANCORANDOM, GENTTROUGH, GENTPEAK, GENTRANDOM, TOBRATROUGH, TOBRAPEAK, TOBRARND, AMIKACINPEAK, AMIKACINTROU, AMIKACIN in the last 72 hours.   Microbiology: No results found for this or any previous visit (from the past 720 hour(s)).  Medical History: Past Medical History:  Diagnosis Date  . AAA (abdominal aortic aneurysm) ([place])   . Arthritis    left hip  . Hyperlipidemia   . Hypertension   . 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 Prostate cancer ([place])     Medications:  F/u med rec  Assessment: CC/HPI: 76 y/o M recently off an airplane became pulseless, found in vfib, shocked 4 times, ROSC. Emergent cardiac cath for DES to LAD. Unknown downtime  PMH: prostate cancer, AAA repair, arthritis, HTN, HLD, iliac artery aneurysm   Goal of Therapy:  Eradication of infection   Plan:  Plan: Unasyn 3g IV q8hr Plan Cangrelor per Dr. Julianne Handler until patient can take po.  Alford Highland, [employer] 10-Aug-2016,6:50 AM

## 2016-07-21 NOTE — Code Documentation (Signed)
Ice packs applied, code cool initiated

## 2016-07-21 NOTE — Consult Note (Signed)
CARDIOLOGY INPATIENT CONSULTATION NOTE  Patient ID: Adrian Reynolds MRN: [id number], DOB/AGE: 1940-11-28   Admit date: 08/12/2016   Primary Physician: Joycelyn Man, MD Primary Cardiologist: new  Reason for Consult:   VF cardiac arrest  Requesting Physician: Lura Em MD (ED)  HPI: This is a 76 y.o. WM with known history of prostate cancer s/p radiation therapy, HTN, AAA repair 14 y ago presented with VF arrest.  Patient is admitted under CCM service and is being managed for cardiac arrest with hypothermia protocol.   Patient was in his usual state of health until 1 AM when he returned from [place] from a skiing trip. Patient did not have any chest pain. His wife woke up around 3:10 Am when she moved him but he did not respond. She found him drooling and then she called 911. His breathing was agonal at that time. EMS came and gave 4 shocks, 5 rounds of epi. Initial rhythm was VF. King airway was placed and patient was intubated on arrival to ED. Patient apparently also showed signs of aspiration. His pupils were minimally reactive but had slight gag.  He was on amiodarone. Initial angiogram revealed obstructive ramus disease, RCA disease and LAD disease.   Problem List: Past Medical History:  Diagnosis Date  . AAA (abdominal aortic aneurysm) ([place])   . Arthritis    left hip  . Hyperlipidemia   . Hypertension   . 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 Prostate cancer [facility])     Past Surgical History:  Procedure Laterality Date  . ABDOMINAL AORTIC ANEURYSM REPAIR    . HERNIA REPAIR     Mesh ventral  . PROSTATE BIOPSY       Allergies: No Known Allergies   Home Medications Current Facility-Administered Medications  Medication Dose Route Frequency Provider Last Rate Last Dose  . 0.9 %  sodium chloride infusion  10-20 mL/hr Intravenous Continuous Merryl Hacker, MD 20 mL/hr at Aug 12, 2016 0456 20 mL/hr at 2016-08-12 0456  . 0.9 %  sodium chloride infusion   Intravenous  Continuous Merryl Hacker, MD 125 mL/hr at August 12, 2016 0456    . [MAR Hold] 0.9 %  sodium chloride infusion   Intra-arterial PRN Charlton Haws, MD      . amiodarone (NEXTERONE PREMIX) 360-4.14 MG/200ML-% (1.8 mg/mL) IV infusion  60 mg/hr Intravenous Continuous Merryl Hacker, MD 33.3 mL/hr at 2016-08-12 0501 60 mg/hr at Aug 12, 2016 0501   Followed by  . amiodarone (NEXTERONE PREMIX) 360-4.14 MG/200ML-% (1.8 mg/mL) IV infusion  30 mg/hr Intravenous Continuous Merryl Hacker, MD      . [place] Sou Hold] aspirin chewable tablet 324 mg  324 mg Oral Once Merryl Hacker, MD      . bivalirudin (ANGIOMAX) 250 mg in sodium chloride 0.9 % 50 mL (5 mg/mL) infusion    Continuous PRN Burnell Blanks, MD 39.9 mL/hr at 2016-08-12 0535 1.75 mg/kg/hr at 2016/08/12 0535  . bivalirudin (ANGIOMAX) BOLUS via infusion    PRN Burnell Blanks, MD   85.5 mg at 08/12/2016 0533  . [MAR Hold] etomidate (AMIDATE) injection   Intravenous PRN Merryl Hacker, MD   20 mg at 2016/08/12 0425  . lidocaine (PF) (XYLOCAINE) 1 % injection    PRN Burnell Blanks, MD   2 mL at 2016-08-12 0528  . [MAR Hold] rocuronium (ZEMURON) injection   Intravenous PRN Merryl Hacker, MD   100 mg at 2016-08-12 0425     Family History  Problem Relation  Age of Onset  . Diabetes Father   . Stroke Father   . Coronary artery disease Father   . Cancer Neg Hx      Social History   Social History  . Marital status: Married    Spouse name: N/A  . Number of children: N/A  . Years of education: N/A   Occupational History  . Not on file.   Social History Main Topics  . Smoking status: Former Smoker    Types: Cigarettes    Quit date: 02/14/1991  . Smokeless tobacco: Never Used  . Alcohol use 0.0 oz/week     Comment: occ  . Drug use: No  . Sexual activity: Yes   Other Topics Concern  . Not on file   Social History Narrative  . No narrative on file     Review of Systems: General: no report of fever, fatigue, weight gain    Cardiovascular: no report of CHF symptoms or chest pain  Dermatological: negative for rash Respiratory: no report of cough, but report of aspiration  Urologic: negative for hematuria Abdominal: no report of nausea/vomiting Neurologic: report of syncope Hematology: no report of anemia Psychiatry: no report of suicidal ideation  Musculoskeletal: no report of leg swelling  Physical Exam: Vitals: BP 125/84   Pulse (!) 122   Temp (!) 96.2 F (35.7 C) (Rectal)   Resp 20   SpO2 (!) 81%  General: intubated/sedated Neck: JVP unable to asses Heart: tachycardiac and rhythm, S1, S2, no murmurs  Lungs: b/l crackles GI: non tender, non distendd, bowel sounds present Extremities: no edema Neuro: pupils 3 mm b/l. Sluggishly reactive, gag reflex intact, GCS 3  Psych: GCS 3   Labs:   Results for orders placed or performed during the hospital encounter of 2016-08-05 (from the past 24 hour(s))  CBC     Status: Abnormal   Collection Time: 05-Aug-2016  4:32 AM  Result Value Ref Range   WBC 11.6 (H) 4.0 - 10.5 K/uL   RBC 4.67 4.22 - 5.81 MIL/uL   Hemoglobin 15.9 13.0 - 17.0 g/dL   HCT 47.6 39.0 - 52.0 %   MCV 101.9 (H) 78.0 - 100.0 fL   MCH 34.0 26.0 - 34.0 pg   MCHC 33.4 30.0 - 36.0 g/dL   RDW 14.4 11.5 - 15.5 %   Platelets 122 (L) 150 - 400 K/uL  Differential     Status: None (Preliminary result)   Collection Time: 08-05-16  4:32 AM  Result Value Ref Range   Neutrophils Relative % PENDING %   Neutro Abs PENDING 1.7 - 7.7 K/uL   Band Neutrophils PENDING %   Lymphocytes Relative PENDING %   Lymphs Abs PENDING 0.7 - 4.0 K/uL   Monocytes Relative PENDING %   Monocytes Absolute PENDING 0.1 - 1.0 K/uL   Eosinophils Relative PENDING %   Eosinophils Absolute PENDING 0.0 - 0.7 K/uL   Basophils Relative PENDING %   Basophils Absolute PENDING 0.0 - 0.1 K/uL   WBC Morphology PENDING    RBC Morphology PENDING    Smear Review PENDING    nRBC PENDING 0 /100 WBC   Metamyelocytes Relative PENDING  %   Myelocytes PENDING %   Promyelocytes Absolute PENDING %   Blasts PENDING %  Protime-INR     Status: Abnormal   Collection Time: 2016/08/05  4:32 AM  Result Value Ref Range   Prothrombin Time 17.2 (H) 11.4 - 15.2 seconds   INR 1.39   APTT  Status: Abnormal   Collection Time: 2016-07-24  4:32 AM  Result Value Ref Range   aPTT 47 (H) 24 - 36 seconds  I-Stat Troponin, ED (0, 3, 6)     Status: Abnormal   Collection Time: Jul 24, 2016  4:34 AM  Result Value Ref Range   Troponin i, poc 0.44 (HH) 0.00 - 0.08 ng/mL   Comment NOTIFIED PHYSICIAN    Comment 3          I-Stat CG4 Lactic Acid, ED     Status: Abnormal   Collection Time: 07/24/16  4:44 AM  Result Value Ref Range   Lactic Acid, Venous 10.20 (HH) 0.5 - 1.9 mmol/L   Comment NOTIFIED PHYSICIAN      Radiology/Studies: Dg Chest Port 1 View  Result Date: 2016/07/24 CLINICAL DATA:  76 year old male status post intubation. Evaluate for tube placement. EXAM: PORTABLE CHEST 1 VIEW COMPARISON:  Chest radiograph dated 11/30/2006 FINDINGS: An endotracheal tube is noted with tip approximately 5 cm above the carina. There is stable moderate cardiomegaly. There is crowding of central vasculature with mild congestive changes. No edema. There is no focal consolidation, pleural effusion, or pneumothorax. The left costophrenic angle has been excluded from the image. Apparent focal discontinuity of the anterior right fifth and sixth ribs noted, likely artifactual. Nondisplaced rib fractures are not excluded. Clinical correlation is recommended. IMPRESSION: 1. Endotracheal tube above the carina. 2. Stable moderate cardiomegaly with mild vascular congestion. 3. Artifact versus less likely nondisplaced fractures of the anterior right fifth and sixth ribs. Clinical correlation is recommended. Electronically Signed   By: Anner Crete M.D.   On: 07-24-16 05:06    EKG: Normal sinus rhythm, Q waves in the anteroseptal leads, ST elevation present   Echo: not  available  Cardiac cath: obstructive mid LAD and D1 culprit lesions, obstructive ramus  Medical decision making:  Discussed care with the patient's family (wife, daughter and son in law) Discussed care with the physician  Reviewed labs and imaging personally Reviewed prior records  ASSESSMENT AND PLAN:  This is a 76 y.o. WM with known history of prostate cancer s/p radiation therapy, HTN, AAA repair 58 y ago presented with VF arrest.    Principal Problem:   STEMI involving left anterior descending coronary artery (HCC) Active Problems:   Hyperlipidemia   Essential hypertension  Ventricular Fibrillation Cardiac arrest, with ST elevation of LAD * possible 30-40 minutes of downtime, initial lactic acid >10, patient aspirated, gag present but no other reflexes, no prior cardiac history, no report of CP, bedside LVEF 25%, hemodynamically stable, on amiodarone, intubated/sedated - CCM consult, cycle troponin, echo in AM, emergent cardiac cath, support hemodynamics for now with levophed if needed, consider hypothermia, emergent PCI No urgent need for temporary pacemaker, r/o infection , DAPT, high dose statin   Signed, Flossie Dibble, MD MS 07/24/16, 5:42 AM

## 2016-07-21 NOTE — Progress Notes (Signed)
eLink Physician-Brief Progress Note Patient Name: BYREN AST DOB: [birth year] MRN: [id number]   Date of Service  07/31/2016  HPI/Events of Note  Sat on 100% and PEEP 12 = 80's.  eICU Interventions  Will order: 1. Increase PEEP to 14.     Intervention Category Major Interventions: Respiratory failure - evaluation and management  Paulena Servais [place] 2016/07/31, 5:43 AM

## 2016-07-21 NOTE — Progress Notes (Signed)
Responded to page to ED for CPR pt. Met w/ family then present, wife (nurse who still does a little work w/ case mgt. here), daughter (Education officer, museum), and daughter's husband. Later son from [place] arrived and daughter from [place] is on way. Pt and wife had returned from skiing trip out [place] to home here about 0030 today. Before 0300 pt's wife noticed he was unresponsive in bed, called EMS. Pt has been to Cath lab, and med staff in ED and after Cath lab procedure have briefed family that pt is still very sick (which they understand). Provided emotional/spiritual support and prayer, ministry of presence and hospitality around hospital. Family is appreciative of the care pt is receiving. Chaplain available for f/u.    2016/08/09 0600  Clinical Encounter Type  Visited With Family;Health care provider  Visit Type Initial;Follow-up;Psychological support;Spiritual support;Social support;Critical Care;ED  Referral From Nurse  Spiritual Encounters  Spiritual Needs Prayer;Emotional  Stress Factors  Patient Stress Factors Health changes;Loss of control  Family Stress Factors Health changes;Loss of control   Gerrit Heck, Chaplain

## 2016-07-21 NOTE — H&P (Addendum)
PULMONARY / CRITICAL CARE MEDICINE   Name: Adrian Reynolds MRN: [id number] DOB: 1940/11/15    ADMISSION DATE:  07-28-2016 CONSULTATION DATE:  Jul 28, 2016  REFERRING MD:  Horton - EDP  CHIEF COMPLAINT:  Cardiac Arrest  HISTORY OF PRESENT ILLNESS:  Pt is encephelopathic; therefore, this HPI is obtained from chart review. Adrian Reynolds is a 76 y.o. male with PMH as outlined below. He was brought to [facility] ED 02/21 with VF arrest.  He was known to be in his usual state of health somewhere around midnight but when wife woke up at 3:10am, she noted that the was drooling, agonal, and unresponsive.  EMS was summoned and upon their arrival, he was found to be in VF.  He received 1 shock before ROSC but then had recurrent VF for which received an additional 3 shocks, 5 rounds of epi, 300mg  amio before ROSC.  He had been in [place] for 2 days prior on a skiing trip.  He had returned home that evening and wife had seen him around midnight and asked him to go to bed (exact time unsure).  She then woke up a 3:10 and found him as above.  In ED, he was noted to have anterolateral STEMI so was taken to cath lab for emergent intervention and had DES x 1 to LAD for 9~99% occlusion.  PCCM was then asked to admit.  SUBJECTIVE: no changes in neurostatus  VITAL SIGNS: BP (!) 75/49   Pulse 83   Temp (!) 94.3 F (34.6 C)   Resp (!) 35   Wt 114 kg (251 lb 5.2 oz)   SpO2 95%   BMI 34.09 kg/m   HEMODYNAMICS:    VENTILATOR SETTINGS: Vent Mode: PRVC FiO2 (%):  [100 %] 100 % Set Rate:  [20 bmp-30 bmp] 30 bmp Vt Set:  [550 mL] 550 mL PEEP:  [10 cmH20-14 cmH20] 14 cmH20 Plateau Pressure:  [22 cmH20] 22 cmH20  INTAKE / OUTPUT: No intake/output data recorded.   PHYSICAL EXAMINATION: General: Adult male, critically ill, in NAD. Neuro: some jerking movements face, no sedation.  No gag, no corneals, no spontaneous respirations. HEENT: [place]/AT. PERRL, sclerae anicteric. Cardiovascular: RRR, no M/R/G.  Lungs: CTA  reduced Abdomen: BS x 4, soft, NT/ND.  Musculoskeletal: No gross deformities, 1+ edema.  Skin: Mottled throughout, cool, no rashes.   LABS:  BMET  Recent Labs Lab 2016-07-28 0432  NA 138  K 3.9  CL 103  CO2 13*  BUN 17  CREATININE 1.31*  GLUCOSE 423*    Electrolytes  Recent Labs Lab 28-Jul-2016 0432  CALCIUM 8.3*    CBC  Recent Labs Lab Jul 28, 2016 0432  WBC 11.6*  HGB 15.9  HCT 47.6  PLT 122*    Coag's  Recent Labs Lab 07-28-16 0432  APTT 47*  INR 1.39    Sepsis Markers  Recent Labs Lab 07/28/2016 0444  LATICACIDVEN 10.20*    ABG  Recent Labs Lab Jul 28, 2016 0617 Jul 28, 2016 0950  PHART 7.050* 7.176*  PCO2ART 54.2* 39.0  PO2ART 70.0* 100.0    Liver Enzymes  Recent Labs Lab 2016-07-28 0432  AST 374*  ALT 420*  ALKPHOS 82  BILITOT 0.9  ALBUMIN 3.3*    Cardiac Enzymes  Recent Labs Lab 2016-07-28 0432  TROPONINI 0.40*    Glucose  Recent Labs Lab Jul 28, 2016 0829  GLUCAP 290*    Imaging Dg Chest Port 1 View  Result Date: 07/28/2016 CLINICAL DATA:  76 year old male status post intubation. Evaluate for tube placement. EXAM:  PORTABLE CHEST 1 VIEW COMPARISON:  Chest radiograph dated 11/30/2006 FINDINGS: An endotracheal tube is noted with tip approximately 5 cm above the carina. There is stable moderate cardiomegaly. There is crowding of central vasculature with mild congestive changes. No edema. There is no focal consolidation, pleural effusion, or pneumothorax. The left costophrenic angle has been excluded from the image. Apparent focal discontinuity of the anterior right fifth and sixth ribs noted, likely artifactual. Nondisplaced rib fractures are not excluded. Clinical correlation is recommended. IMPRESSION: 1. Endotracheal tube above the carina. 2. Stable moderate cardiomegaly with mild vascular congestion. 3. Artifact versus less likely nondisplaced fractures of the anterior right fifth and sixth ribs. Clinical correlation is recommended.  Electronically Signed   By: Anner Crete M.D.   On: 2016-07-26 05:06   Dg Abd Portable 1v  Result Date: Jul 26, 2016 CLINICAL DATA:  Orogastric tube placement. EXAM: PORTABLE ABDOMEN - 1 VIEW COMPARISON:  03/21/2016 FINDINGS: Orogastric or nasogastric tube enters the abdomen, loops in the stomach in has its tip in the region of the antrum. Aorto iliac stent graft present. Urinary tract contrast is present. IMPRESSION: Orogastric or nasogastric tube tip at the gastric antrum. Electronically Signed   By: Nelson Chimes M.D.   On: 07-26-2016 08:20     STUDIES:  CXR 02/21 > cardiomegaly. Echo 02/21 > EEG 02/21 >   CULTURES: Blood 02/21 > Sputum 02/21 >   ANTIBIOTICS: Unasyn 02/21 >   SIGNIFICANT EVENTS: 02/21 > admit.  LINES/TUBES: ETT 02/21 > Art line 02/21 >  DISCUSSION: 76 y.o. male admitted 02/21 with VF cardiac arrest.  Found to have anterolateral STEMI so taken to cath lab for emergent intervention and had DES x 1 to LAD.  ASSESSMENT / PLAN:  CARDIOVASCULAR A:  S/p VF arrest - unknown downtime. Cardiogenic shock - due to above. Hx HTN, HLD, AAA (s/p repair some 20 years ago). P:  Defer TTM given unknown downtime. Levophed PRN for goal MAP > 60, we are maxed at 40 mic Cortisol No role vaso Trend trop, lactate - noted, poor prgnosis Assess echo - pending Cards following Continue ASA, atorvastatin, brilinta cvp and line will not change outcome  PULMONARY A: Acute hypoxic respiratory failure - due to VF cardiac arrest. Presumed aspiration - per EMS, signs of aspiration during placement of king airway. Pulm edema, hypoxia, hypercapnia P:   ABg reviewed from repeatr Rate to 35 To 70%, then to goal peep 10 if able Need lasix as unable to ventilate NO ROLE bicarb - no ards  RENAL A:   AKI AGMA - lactate P:   NS @ 76 to kvo Lasix drip, unable to oxygenate BMP in AM and pm on drip  GASTROINTESTINAL A:   GI prophylaxis. Nutrition. Shock liver P:    SUP: Pantoprazole. NPO. Hold feeds lft in am   HEMATOLOGIC / ONCOLOGIC A:   VTE Prophylaxis. Hx prostate CA (s/p XRT Oct 2017). P:  SCD's / heparin. CBC in AM.  INFECTIOUS A:   Presumed aspiration - per EMS, signs of aspiration during placement of king airway. P:   Abx as above (unasyn) Sputum unimpressed  ENDOCRINE A:   Hyperglycemia - no hx DM. P:   SSI. cortisol  NEUROLOGIC A:   Acute encephalopathy - concern for anoxia given unknown downtime. mycolonus doubt seziure focus Poor prognosis P:   Sedation:  Fentanyl PRN / Midazolam PRN. RASS goal: 0  Daily WUA. Assess EEG done await result Need to be dnr, consider comfort Add keppra for  now  Family updated: Wife to be updated by cards and ME  Interdisciplinary Family Meeting v Palliative Care Meeting:  Due by: 07/19/16.   Ccm time 45 min   Lavon Paganini. Titus Mould, MD, FACP Pgr: [facility]  I have had extensive discussions with family wife. We discussed patients current circumstances and organ failures. We also discussed patient's prior wishes under circumstances such as this. Family has decided to NOT perform resuscitation if arrest but to continue current medical support for now. Also no line wished will not chang eoutcome   Lavon Paganini. Titus Mould, MD, [place] Pgr: [facility] '

## 2016-07-21 NOTE — Procedures (Signed)
ELECTROENCEPHALOGRAM REPORT  Date of Study: 07-26-2016  Patient's Name: Adrian Reynolds MRN: [id number] Date of Birth: [birth year]  Referring Provider: Montey Hora, PA-C  Clinical History: This is a 76 year old man s/p cardiac arrest.   Medications: acetaminophen (TYLENOL) tablet 650 mg  amiodarone (NEXTERONE PREMIX) 360-4.14 MG/200ML-% (1.8 mg/mL) IV infusion  Ampicillin-Sulbactam (UNASYN) 3 g in sodium chloride 0.9 % 100 mL IVPB  aspirin chewable tablet 324 mg  aspirin chewable tablet 81 mg  atorvastatin (LIPITOR) tablet 20 mg  calcium gluconate 1 g in sodium chloride 0.9 % 100 mL IVPB  cangrelor (KENGREAL) 50 mg in sodium chloride 0.9 % 250 mL (0.2 mg/mL) infusion  etomidate (AMIDATE) injection  fentaNYL (SUBLIMAZE) injection 50 mcg  fentaNYL (SUBLIMAZE) injection 50 mcg  heparin injection 5,000 Units  insulin aspart (novoLOG) injection 0-15 Units  midazolam (VERSED) injection 1 mg  midazolam (VERSED) injection 1 mg  norepinephrine (LEVOPHED) 4 mg in dextrose 5 % 250 mL (0.016 mg/mL) infusion  ondansetron (ZOFRAN) injection 4 mg  pantoprazole (PROTONIX) injection 40 mg  rocuronium (ZEMURON) injection  sodium bicarbonate 100 mEq in dextrose 5 % 1,000 mL infusion  sodium chloride flush (NS) 0.9 % injection 3 mL  sodium chloride flush (NS) 0.9 % injection 3 mL  ticagrelor (BRILINTA) tablet 180 mg   Technical Summary: A multichannel digital EEG recording measured by the international 10-20 system with electrodes applied with paste and impedances below 5000 ohms performed in our laboratory with EKG monitoring in an intubated and unresponsive patient. Temperature of 34.6 degrees Celsius.  Hyperventilation and photic stimulation were not performed.  The digital EEG was referentially recorded, reformatted, and digitally filtered in a variety of bipolar and referential montages for optimal display.    Description: The patient is intubated and unresponsive during the recording. There  is loss of normal background activity. The record read at a sensitivity of 3 uV/mm shows diffuse suppression of background activity. There are occasional bursts of generalized sharp transients associated with myocloinc jerks. Interburst intervals last from 10 seconds up to 5 minutes. There is no spontaneous reactivity or reactivity noted with noxious stimulation. Hyperventilation and photic stimulation were not performed. There were no electrographic seizures seen.   EKG lead was unremarkable.  Impression: This EEG is markedly abnormal due to burst-suppression pattern with associated myoclonic jerks.  Clinical Correlation: This record shows evidence of severe diffuse or bilateral cerebral dysfunction which more likely reflects the patient's history of anoxic brain injury as opposed to ictal epileptiform activity. In the absence of CNS active, sedating, or anesthetic medications, this suggests a poor prognosis. Clinical correlation is advised

## 2016-07-21 NOTE — Progress Notes (Signed)
Pt. Transported uneventfully from cath lab to 4n-21.

## 2016-07-21 NOTE — Progress Notes (Signed)
EEG completed; results pending.    

## 2016-07-21 NOTE — Progress Notes (Signed)
Pt passed away at 1250 with family at bedside. Auscultated heart and lung sounds with 2 RNs (Newman Nickels and [facility] [place]). No heart and lung sounds auscultated. MD informed of time of death and CDS called. Chaplain offered. Support given to family.   Wasted 40 mL versed gtt in sink with [employer] RN

## 2016-07-21 NOTE — Code Documentation (Signed)
Family updated as to patient's status.

## 2016-07-21 NOTE — ED Triage Notes (Signed)
Pt comes from homes via [facility] was on a plane about 6 hours ago, wife states that pt made a noise and then was found pulseless, found in vfib, shocked 4 times, ROSC after 2 epi, PTA received total of 5 epi and 300 amio. Hx of cancer, htn and triple a

## 2016-07-21 NOTE — Progress Notes (Signed)
Arterial line unsuccessful. Pt is being transported to the Cath lab at this time.

## 2016-07-21 NOTE — H&P (Signed)
PULMONARY / CRITICAL CARE MEDICINE   Name: Adrian Reynolds MRN: [id number] DOB: [birth year]    ADMISSION DATE:  07/25/16 CONSULTATION DATE:  07/25/16  REFERRING MD:  Horton - EDP  CHIEF COMPLAINT:  Cardiac Arrest  HISTORY OF PRESENT ILLNESS:  Pt is encephelopathic; therefore, this HPI is obtained from chart review. Adrian Reynolds is a 76 y.o. male with PMH as outlined below. He was brought to [facility] ED 02/21 with VF arrest.  He was known to be in his usual state of health somewhere around midnight but when wife woke up at 3:10am, she noted that the was drooling, agonal, and unresponsive.  EMS was summoned and upon their arrival, he was found to be in VF.  He received 1 shock before ROSC but then had recurrent VF for which received an additional 3 shocks, 5 rounds of epi, 300mg  amio before ROSC.  He had been in [place] for 2 days prior on a skiing trip.  He had returned home that evening and wife had seen him around midnight and asked him to go to bed (exact time unsure).  She then woke up a 3:10 and found him as above.  In ED, he was noted to have anterolateral STEMI so was taken to cath lab for emergent intervention and had DES x 1 to LAD for 9~99% occlusion.  PCCM was then asked to admit.   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  PAST SURGICAL HISTORY: He  has a past surgical history that includes Abdominal aortic aneurysm repair; Hernia repair; and Prostate biopsy.  No Known Allergies  No current facility-administered medications on file prior to encounter.    Current Outpatient Prescriptions on File Prior to Encounter  Medication Sig  . allopurinol (ZYLOPRIM) 300 MG tablet Take 1 tablet (300 mg total) by mouth daily.  Marland Kitchen aspirin 81 MG tablet Take 81 mg by mouth daily.    Marland Kitchen atorvastatin (LIPITOR) 20 MG tablet Take 1 tablet (20  mg total) by mouth at bedtime.  Marland Kitchen lisinopril-hydrochlorothiazide (PRINZIDE,ZESTORETIC) 20-25 MG tablet TAKE ONE TABLET EACH DAY  . NEOMYCIN-POLYMYXIN-HYDROCORTISONE (CORTISPORIN) 1 % SOLN otic solution Place 2 drops into the right ear every 8 (eight) hours.  . predniSONE (DELTASONE) 20 MG tablet One tablet x 5 days or until your skin clears completely then, a half a tablet x 5 days, then half a tablet Monday, Wednesday, Friday, for two months (Patient taking differently: as needed. One tablet x 5 days or until your skin clears completely then, a half a tablet x 5 days, then half a tablet Monday, Wednesday, Friday, for two months)    FAMILY HISTORY:  His indicated that his mother is deceased. He indicated that his father is deceased. He indicated that the status of his neg hx is unknown.    SOCIAL HISTORY: He  reports that he quit smoking about 25 years ago. His smoking use included Cigarettes. He has never used smokeless tobacco. He reports that he drinks alcohol. He reports that he does not use drugs.  REVIEW OF SYSTEMS:   Unable to obtain as pt is encephalopathic.  SUBJECTIVE:  On vent, unresponsive.  VITAL SIGNS: BP 125/84   Pulse (!) 122   Temp (!) 96.2 F (35.7 C) (Rectal)   Resp 20   SpO2 (!) 81%   HEMODYNAMICS:    VENTILATOR SETTINGS: Vent Mode: PRVC  FiO2 (%):  [100 %] 100 % Set Rate:  [20 bmp] 20 bmp Vt Set:  [550 mL] 550 mL PEEP:  [10 cmH20] 10 cmH20 Plateau Pressure:  [22 cmH20] 22 cmH20  INTAKE / OUTPUT: No intake/output data recorded.   PHYSICAL EXAMINATION: General: Adult male, critically ill, in NAD. Neuro: Unresponsive, no sedation.  No gag, no corneals, no spontaneous respirations. HEENT: [place]/AT. PERRL, sclerae anicteric. Cardiovascular: RRR, no M/R/G.  Lungs: Respirations even and unlabored.  Coarse bilaterally. Abdomen: BS x 4, soft, NT/ND.  Musculoskeletal: No gross deformities, 1+ edema.  Skin: Mottled throughout, cool, no  rashes.    LABS:  BMET  Recent Labs Lab 08-03-16 0432  NA 138  K 3.9  CL 103  CO2 13*  BUN 17  CREATININE 1.31*  GLUCOSE 423*    Electrolytes  Recent Labs Lab 2016-08-03 0432  CALCIUM 8.3*    CBC  Recent Labs Lab 08/03/16 0432  WBC 11.6*  HGB 15.9  HCT 47.6  PLT 122*    Coag's  Recent Labs Lab 08/03/2016 0432  APTT 47*  INR 1.39    Sepsis Markers  Recent Labs Lab 2016-08-03 0444  LATICACIDVEN 10.20*    ABG No results for input(s): PHART, PCO2ART, PO2ART in the last 168 hours.  Liver Enzymes  Recent Labs Lab 03-Aug-2016 0432  AST 374*  ALT 420*  ALKPHOS 82  BILITOT 0.9  ALBUMIN 3.3*    Cardiac Enzymes  Recent Labs Lab 2016-08-03 0432  TROPONINI 0.40*    Glucose No results for input(s): GLUCAP in the last 168 hours.  Imaging Dg Chest Port 1 View  Result Date: Aug 03, 2016 CLINICAL DATA:  76 year old male status post intubation. Evaluate for tube placement. EXAM: PORTABLE CHEST 1 VIEW COMPARISON:  Chest radiograph dated 11/30/2006 FINDINGS: An endotracheal tube is noted with tip approximately 5 cm above the carina. There is stable moderate cardiomegaly. There is crowding of central vasculature with mild congestive changes. No edema. There is no focal consolidation, pleural effusion, or pneumothorax. The left costophrenic angle has been excluded from the image. Apparent focal discontinuity of the anterior right fifth and sixth ribs noted, likely artifactual. Nondisplaced rib fractures are not excluded. Clinical correlation is recommended. IMPRESSION: 1. Endotracheal tube above the carina. 2. Stable moderate cardiomegaly with mild vascular congestion. 3. Artifact versus less likely nondisplaced fractures of the anterior right fifth and sixth ribs. Clinical correlation is recommended. Electronically Signed   By: Anner Crete M.D.   On: 03-Aug-2016 05:06     STUDIES:  CXR 02/21 > cardiomegaly. Echo 02/21 > EEG 02/21 >   CULTURES: Blood  02/21 > Sputum 02/21 >   ANTIBIOTICS: Unasyn 02/21 >   SIGNIFICANT EVENTS: 02/21 > admit.  LINES/TUBES: ETT 02/21 > CVL pending 02/21 > Art line pending 02/21 >  DISCUSSION: 76 y.o. male admitted 02/21 with VF cardiac arrest.  Found to have anterolateral STEMI so taken to cath lab for emergent intervention and had DES x 1 to LAD.  ASSESSMENT / PLAN:  CARDIOVASCULAR A:  S/p VF arrest - unknown downtime. Cardiogenic shock - due to above. Hx HTN, HLD, AAA (s/p repair some 20 years ago). P:  Defer TTM given unknown downtime. Levophed PRN for goal MAP > 65. Trend trop, lactate. Assess echo. Cards following, appreciate the assistance. Continue preadmission ASA, atorvastatin. Hold preadmission prinzide.  PULMONARY A: Acute hypoxic respiratory failure - due to VF cardiac arrest. Presumed aspiration - per EMS, signs of aspiration during placement of king airway. P:  Full vent support. Wean as able. VAP prevention measures. SBT in AM if neuro status permits. Abx / cultures per ID section. CXR in AM.  RENAL A:   AKI. Hypocalcemia - corrects to 8.8. AGMA - lactate. P:   NS @ 75. 1g Ca gluconate. BMP in AM.  GASTROINTESTINAL A:   GI prophylaxis. Nutrition. P:   SUP: Pantoprazole. NPO.  HEMATOLOGIC / ONCOLOGIC A:   VTE Prophylaxis. Hx prostate CA (s/p XRT Oct 2017). P:  SCD's / heparin. CBC in AM.  INFECTIOUS A:   Presumed aspiration - per EMS, signs of aspiration during placement of king airway. P:   Abx as above (unasyn).  Follow cultures as above.  ENDOCRINE A:   Hyperglycemia - no hx DM. P:   SSI. Assess Hgb A1c.  NEUROLOGIC A:   Acute encephalopathy - concern for anoxia given unknown downtime. P:   Sedation:  Fentanyl PRN / Midazolam PRN. RASS goal: 0 to -1. Daily WUA. Assess EEG.  Family updated: Wife to be updated by cards.  Interdisciplinary Family Meeting v Palliative Care Meeting:  Due by: 07/19/16.  CC time: 35  min.   Montey Hora, [place] [facility] Pager: [phone]  or [phone] 2016-07-28, 6:45 AM    ATTENDING NOTE / ATTESTATION NOTE :   I have discussed the case with the resident/APP  Montey Hora PA.   I agree with the resident/APP's  history, physical examination, assessment, and plans.    I have edited the above note and modified it according to our agreed history, physical examination, assessment and plan.   Briefly, Adrian Reynolds is a 76 y.o. Male who was brought to [facility] ED 02/21 with VF arrest.  He was known to be in his usual state of health somewhere around midnight but when wife woke up at 3:10am, wife noted that the was drooling, agonal, and unresponsive.  EMS was summoned and upon their arrival, he was found to be in VF.  He received 1 shock before ROSC but then had recurrent VF for which received an additional 3 shocks, 5 rounds of epi, 300mg  amio before ROSC.  He had been in [place] for 2 days prior on a skiing trip.  He had returned home that evening and wife had seen him around midnight and asked him to go to bed (exact time unsure).  She then woke up a 3:10 and found him as above.  In ED, he was noted to have anterolateral STEMI so was taken to cath lab for emergent intervention and had DES x 1 to LAD for 9~99% occlusion.  PCCM was then asked to admit. Patient did not receive  any sedation during intubation or LHC. BP started dropping during LHC. Pt requiring 100% FiO2 with PEEP of 14 as O2 sats dropped during procedure.   Vitals:  Vitals:   2016-07-28 0634 Jul 28, 2016 0635 07-28-2016 0640 07-28-16 0645  BP: (!) 80/53 (!) 81/52 (!) 75/49   Pulse: 90 92 (!) 0 (!) 0  Resp:   (!) 45 18  Temp:      TempSrc:      SpO2:   (!) 0% (!) 0%    Constitutional/General: well-nourished, well-developed, intubated,  not in any distress. Cyanotic, mottled. Moribund.   There is no height or weight on file to calculate BMI. Wt Readings from Last 3  Encounters:  03/28/16 114.3 kg (252 lb)  02/26/16 116.4 kg (256 lb 9.6 oz)  02/18/16 116.2 kg (256 lb 3.2 oz)    HEENT: PERLA, anicteric sclerae. (-) Oral thrush. Intubated, ETT in place  Neck: No masses. Midline trachea. No JVD, (-) LAD. (-) bruits appreciated.  Respiratory/Chest: Grossly normal chest. (-) deformity. (-) Accessory muscle use.  Symmetric expansion. Diminished BS on both lower lung zones. (-) wheezing, crackles, rhonchi (-) egophony  Cardiovascular: Regular rate and  rhythm, heart sounds normal, no murmur or gallops,  Trace peripheral edema  Gastrointestinal:  Decreased bowel sounds. Soft, non-tender. No hepatosplenomegaly.  (-) masses.   Musculoskeletal:  (-) gross abnormality seen.   Extremities: Grossly normal. (-) clubbing, Cyanotic, mottled. Distal extremities were cool (-) edema  Skin: (-) rash,lesions seen.   Neurological/Psychiatric : intubated. Pupils were 2-3 mm non reactive, (-) corneals, (-) facial assymetry, (-) gag.  (-) breathing over the vent noted. (-) response to deep sternal rub.  (-) latalizing signs. Neck was supple.     CBC Recent Labs     08/11/16  0432  WBC  11.6*  HGB  15.9  HCT  47.6  PLT  122*    Coag's Recent Labs     Aug 11, 2016  0432  APTT  47*  INR  1.39    BMET Recent Labs     08-11-2016  0432  NA  138  K  3.9  CL  103  CO2  13*  BUN  17  CREATININE  1.31*  GLUCOSE  423*    Electrolytes Recent Labs     08-11-16  0432  CALCIUM  8.3*    Sepsis Markers No results for input(s): PROCALCITON, O2SATVEN in the last 72 hours.  Invalid input(s): LACTICACIDVEN  ABG No results for input(s): PHART, PCO2ART, PO2ART in the last 72 hours.  Liver Enzymes Recent Labs     11-Aug-2016  0432  AST  374*  ALT  420*  ALKPHOS  82  BILITOT  0.9  ALBUMIN  3.3*    Cardiac Enzymes Recent Labs     08-11-2016  0432  TROPONINI  0.40*    Glucose No results for input(s): GLUCAP in the last 72 hours.  Imaging Dg  Chest Port 1 View  Result Date: 2016/08/11 CLINICAL DATA:  76 year old male status post intubation. Evaluate for tube placement. EXAM: PORTABLE CHEST 1 VIEW COMPARISON:  Chest radiograph dated 11/30/2006 FINDINGS: An endotracheal tube is noted with tip approximately 5 cm above the carina. There is stable moderate cardiomegaly. There is crowding of central vasculature with mild congestive changes. No edema. There is no focal consolidation, pleural effusion, or pneumothorax. The left costophrenic angle has been excluded from the image. Apparent focal discontinuity of the anterior right fifth and sixth ribs noted, likely artifactual. Nondisplaced rib fractures are not excluded. Clinical correlation is recommended. IMPRESSION: 1. Endotracheal tube above the carina. 2. Stable moderate cardiomegaly with mild vascular congestion. 3. Artifact versus less likely nondisplaced fractures of the anterior right fifth and sixth ribs. Clinical correlation is recommended. Electronically Signed   By: Anner Crete M.D.   On: 08/11/2016 05:06    Assessment: S/P Cardiac Arrest, Vfib, unknown downtime, Secondary to Acute STEMI S/P LHC with DES in LAD Cardiogenic shock 2/2 STEMI, concern for acute pulmonary edema Acute hypoxemic respiratory failure 2/2 Acute STEMI + Unable to protect airway + Aspiration pneumonia/pneumonitis Anoxic ischemic Encephalopathy 2/2 prolonged arrest Severe metabolic acidosis 2/2 above.  AKI 2/2 above Transaminitis 2/2 above  Plan : 1. Based on clinical presentation and physical examination (cyanotic, mottled) and labs (elevated  lactate at 10), overall prognosis is grim. Patient looked moribund. I discussed the case with cardiology Dr. Angelena Form. Because of grim prognosis, we decided to hold off on targeted temperature management.  2. Cardiology is to speak with family and update them. 3. Cont supportive care for now.  4. Continue ventilatory support. Currently on 100% FiO2, PEEP 14. ABG  early this am showed hypercapnea.  Not sure if changes were made on the vent. Rpt abg this am.  Keep O2 saturations more than 88%. 5. Start levo fed. Keep MAP > 65 mm Hg.  6. Continue with amiodarone drip.  7. Start bicarbonate drip.  Will give 2 amps of NaHCo3 now. Repeat BMP in 2 hrs. May need more bicarb pushes.  6. Versed and fentanyl pushes if needed.  7. Needs EEG and MRI if ever for further prognostication.  8. Keep NPO.  9. Unasyn for possible asp pneumonia.  10. Panculture 11. Trend lactate. 12. Heparin sub Q for DVT prophylaxis.  Protonix for SUP.    I spent  30 minutes of Critical Care time with this patient today. This is my time spent independent of the APP or resident.   Family :Family updated at length today by Dr. Angelena Form.   Monica Becton, MD 07-21-2016, 6:50 AM [place] [facility] Pager [phone] After 3 pm or if no answer, call [phone]

## 2016-07-21 DEATH — deceased

## 2016-08-21 NOTE — Discharge Summary (Signed)
NAME:  Adrian Reynolds, Adrian Reynolds NO.:  [id number]  MEDICAL RECORD NO.:  [id number]  LOCATION:  [facility]                         FACILITY:  [facility]  PHYSICIAN:  Raylene Miyamoto, MD DATE OF BIRTH:  04/25/1941  DATE OF ADMISSION:  08/09/16 DATE OF DISCHARGE:                              DISCHARGE SUMMARY   DEATH SUMMARY  This is a very active 76 year old male with past medical history noted in HPI.  The patient was very active with skiing still of his age with his family, brought into [facility] on 09-Aug-2016, secondary to VFib arrest.  He was known to be in his usual state of health, around midnight or so, with him the wife woke up at 3:10 a.m. and noted to have drooling, agonal respirations and was unresponsive. EMS was called.  Upon arrival, found to be in VFib, received a shock before rusk, but then had recurrent VFib, received three additional shocks, 5 rounds of epi and 300 mg of amio before rusk.  __________ 2 days prior.  In the ED, was noted to have anterolateral STEMI, taken to the cardiac catheterization lab where he had a stent placed for LAD of 99% occlusion.  Critical care with admission was obtained. 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  Echocardiogram was then ordered, cultures were obtained.  The patient was being treated for aspiration pneumonia and also with Unasyn.  The patient was not treated with TPN given the unknown downtime and presentation of severe neurologic impairment.  He was supported maximally and despite that was worsening with his oxygenation needs on the ventilator machine, had shock liver as well.  The discussion with family was very active in terms of summoning family for visitation of out of town and unfortunately, the patient coded with a DNR established and  expired.  FINAL DIAGNOSES UPON DEATH: 1. 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 2. ST segment elevation myocardial infarction, status post stent     placement. 3. Cardiogenic shock. 4. 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 5. Acute respiratory failure. 6. Aspiration pneumonia.     Raylene Miyamoto, MD     DJF/MEDQ  D:  07/25/2016  T:  07/25/2016  Job:  [id number]

## 2017-04-03 ENCOUNTER — Ambulatory Visit: Payer: Medicare Other | Admitting: Surgery
